# Patient Record
Sex: Female | Born: 1985 | Race: White | Hispanic: No | Marital: Married | Smoking: Former smoker
Health system: Southern US, Community
[De-identification: ages and names within clinical notes are randomized; demographics above are authoritative.]

## PROBLEM LIST (undated history)

## (undated) DIAGNOSIS — R87629 Unspecified abnormal cytological findings in specimens from vagina: Secondary | ICD-10-CM

## (undated) DIAGNOSIS — F329 Major depressive disorder, single episode, unspecified: Secondary | ICD-10-CM

## (undated) DIAGNOSIS — F419 Anxiety disorder, unspecified: Secondary | ICD-10-CM

## (undated) DIAGNOSIS — F32A Depression, unspecified: Secondary | ICD-10-CM

## (undated) DIAGNOSIS — N83201 Unspecified ovarian cyst, right side: Secondary | ICD-10-CM

## (undated) HISTORY — DX: Unspecified ovarian cyst, right side: N83.201

## (undated) HISTORY — PX: BREAST SURGERY: SHX581

## (undated) HISTORY — PX: WISDOM TOOTH EXTRACTION: SHX21

## (undated) HISTORY — DX: Unspecified abnormal cytological findings in specimens from vagina: R87.629

## (undated) HISTORY — DX: Depression, unspecified: F32.A

## (undated) HISTORY — DX: Anxiety disorder, unspecified: F41.9

## (undated) HISTORY — DX: Major depressive disorder, single episode, unspecified: F32.9

## (undated) HISTORY — PX: NO PAST SURGERIES: SHX2092

---

## 2011-01-18 HISTORY — PX: BREAST SURGERY: SHX581

## 2015-11-05 ENCOUNTER — Encounter: Payer: Self-pay | Admitting: Gynecologic Oncology

## 2015-11-05 ENCOUNTER — Ambulatory Visit: Payer: PRIVATE HEALTH INSURANCE | Attending: Gynecologic Oncology | Admitting: Gynecologic Oncology

## 2015-11-05 VITALS — BP 114/66 | HR 73 | Temp 98.8°F | Resp 18 | Ht 64.25 in | Wt 128.4 lb

## 2015-11-05 DIAGNOSIS — Z1501 Genetic susceptibility to malignant neoplasm of breast: Secondary | ICD-10-CM

## 2015-11-05 DIAGNOSIS — Z1502 Genetic susceptibility to malignant neoplasm of ovary: Secondary | ICD-10-CM | POA: Diagnosis not present

## 2015-11-05 DIAGNOSIS — Z87891 Personal history of nicotine dependence: Secondary | ICD-10-CM | POA: Diagnosis not present

## 2015-11-05 DIAGNOSIS — Z803 Family history of malignant neoplasm of breast: Secondary | ICD-10-CM | POA: Insufficient documentation

## 2015-11-05 DIAGNOSIS — Z1509 Genetic susceptibility to other malignant neoplasm: Secondary | ICD-10-CM

## 2015-11-05 NOTE — Patient Instructions (Addendum)
Plan to have an ultrasound and CA 125 on October 25.  [facility] at 10:15 to have your CA 125 drawn then head [facility] for your ultrasound and arrive with a full bladder.  Plan again for a CA 125 and ultrasound in six months time prior to your appointment with Dr. Nelly RoutBrewster.  Please call us closer to the date so we can schedule your ultrasound (it must be between day 3-10 of your cycle).  At that time, we will arrange for a lab appt and adjust your appointment with Dr. Nelly RoutBrewster as well if needed.

## 2015-11-05 NOTE — Progress Notes (Signed)
Consult Note: Gyn-Onc     Sandra Petty 30 y.o. female- self-referral  CC:  Chief Complaint  Patient presents with  . BRCA 2    New patient    Assessment/Plan:  Ms. Sandra Petty is a 30 y.o.  with a deleterious BRCA2 mutation. 1. The risk of ovarian cancer in patients with BRCA 2 mutations is approximately 11-23% to the age of 42.  In addition, patients with BRCA mutations are also at increased risk of breast cancer, and an increased (but still low) risk of pancreatic cancer and melanoma. 2. The [employer]  (NCCN) recommends removal of bilateral ovaries and fallopian tubes between the ages of 67-40, and/or when childbearing is completed, to reduce the risk of ovarian and fallopian tube cancer.  If a  patient does not undergo risk-reducing surgery, it is recommended they have CA-125 serum levels and pelvic ultrasounds every 6 months as a screening approach, although these are not particularly sensitive methods of screening.  Today is day 3 of her cycle. The ultrasound and CA-125 will be scheduled for next week.  Follow-up in 6 months with CA-125 and ultrasound immediately prior to the visit 3. Preventive surgery to remove the ovaries and fallopian tubes reduces the risk of a related cancer by 80% in women who carry a  BRCA2 mutation.  Women who undergo preventive surgery retain a 4% risk of developing cancer of the peritoneum.  4. Although long-term oral contraceptive use is suggested to reduce the risk of ovarian cancer among women who carry mutations of BRCA2, data on the effect  of oral contraceptives on breast cancer risk are inconsistent. 5. Consultation with a reproductive endocrinologist may be of benefit given the two-year attempt to become pregnant   HPI: Ms. Sandra Petty is a 30 y.o.  gravida 0 with a deleterious BRCA2 gene mutation. She is married and has been casually trying to become pregnant for the last 2 years.  Family history is notable for a mother  diagnosed with 2 primary breast cancers first at age 48 and the second at age 23. There is no other family history of pancreatic prostate or ovarian cancer. She states that she is of German/Russian ethnicity and denies any asking Asian heritage. Last MRI of the breast was in 2016.  Review of Systems:  Constitutional  Feels well,  Cardiovascular  No chest pain, shortness of breath, or edema  Pulmonary  No cough or wheeze.  Gastro Intestinal  No nausea, vomitting, or diarrhoea. No bright red blood per rectum, no abdominal pain, change in bowel movement, or constipation, No abdominal bloating or early satiety Genito Urinary  No frequency, urgency, dysuria, Reports midcycle bleeding  Musculo Skeletal  No myalgia, arthralgia, joint swelling or pain  Neurologic  No weakness, numbness, change in gait,  Psychology  No depression, anxiety, insomnia.    Current Meds:  Outpatient Encounter Prescriptions as of 11/05/2015  Medication Sig  . EVENING PRIMROSE OIL PO Take 1,500 mg by mouth daily.  . Multiple Vitamins-Minerals (WOMENS MULTI) CAPS Take by mouth.   No facility-administered encounter medications on file as of 11/05/2015.     Allergy: Not on File  Social Hx:   Social History   Social History  . Marital status: Married    Spouse name: N/A  . Number of children: N/A  . Years of education: N/A   Occupational History  . Not on file.   Social History Main Topics  . Smoking status: Former Smoker    Packs/day:  0.25    Types: Cigarettes    Quit date: 11/04/2008  . Smokeless tobacco: Never Used  . Alcohol use Yes     Comment: wine with dinner   . Drug use: No  . Sexual activity: Not on file   Other Topics Concern  . Not on file   Social History Narrative  . No narrative on file    Past Surgical Hx: History reviewed. No pertinent surgical history.  Past Medical Hx:  Past Medical History:  Diagnosis Date  . Anxiety   . Depression     Past Gynecological  History:Gravida 0, and acute age 30 regular menses with midcycle spotting use Yaz for several years in her mid 32s. Last Pap test March 2017 within normal limits. Has been casually trying to become pregnant for approximately 2 years   Family Hx:  Family History  Problem Relation Age of Onset  . Cancer Mother   . Cancer Paternal Grandmother   . Cancer Paternal Grandfather     Vitals:  Blood pressure 114/66, pulse 73, temperature 98.8 F (37.1 C), temperature source Oral, resp. rate 18, height 5' 4.25" (1.632 m), weight 128 lb 6.4 oz (58.2 kg), SpO2 100 %.  Physical Exam: WD in NAD Neck  Supple NROM, without any enlargements.  Lymph Node Survey No cervical supraclavicular or inguinal adenopathy Cardiovascular  Pulse normal rate, regularity and rhythm. .  Lungs  Clear to auscultation bilaterally, without wheezes/crackles/rhonchi. Good air movement.  Skin  No rash/lesions/breakdown  Psychiatry  Alert and oriented appropriate mood affect speech and reasoning. Abdomen  Normoactive bowel sounds, abdomen soft, non-tender.  Back No CVA tenderness Genito Urinary  Vulva/vagina: Normal external female genitalia.  No lesions. No discharge or bleeding.  Bladder/urethra:  No lesions or masses  Vagina: Well estrogenized blood present in the vaginal vault .  Cervix: Normal appearing, no lesions, 2 cm blood in the cervical os  Uterus: Mobile 6 cm, no parametrial involvement or nodularity.  Adnexa: No palpable masses. No cul-de-sac nodularity Rectal  Good tone, no masses no cul de sac nodularity.  Extremities  No bilateral cyanosis, clubbing or edema.   Janie Morning, MD, PhD 11/05/2015, 5:17 PM

## 2015-11-11 ENCOUNTER — Ambulatory Visit (HOSPITAL_COMMUNITY)
Admission: RE | Admit: 2015-11-11 | Discharge: 2015-11-11 | Disposition: A | Discharge status: Home / Self Care | Payer: PRIVATE HEALTH INSURANCE | Source: Ambulatory Visit | Attending: Gynecologic Oncology | Admitting: Gynecologic Oncology

## 2015-11-11 ENCOUNTER — Other Ambulatory Visit (HOSPITAL_BASED_OUTPATIENT_CLINIC_OR_DEPARTMENT_OTHER): Payer: PRIVATE HEALTH INSURANCE

## 2015-11-11 DIAGNOSIS — R938 Abnormal findings on diagnostic imaging of other specified body structures: Secondary | ICD-10-CM | POA: Diagnosis not present

## 2015-11-11 DIAGNOSIS — Z1502 Genetic susceptibility to malignant neoplasm of ovary: Secondary | ICD-10-CM | POA: Diagnosis not present

## 2015-11-11 DIAGNOSIS — Z1509 Genetic susceptibility to other malignant neoplasm: Secondary | ICD-10-CM

## 2015-11-11 DIAGNOSIS — Z1501 Genetic susceptibility to malignant neoplasm of breast: Secondary | ICD-10-CM

## 2015-11-12 LAB — CA 125: CANCER ANTIGEN (CA) 125: 11.1 U/mL (ref 0.0–38.1)

## 2015-11-13 ENCOUNTER — Telehealth: Payer: Self-pay | Admitting: Gynecologic Oncology

## 2015-11-13 NOTE — Telephone Encounter (Signed)
Patient called with several questions about her CA 125 and US results.  All questions answered.  Advised to call for any needs or concerns.

## 2015-11-13 NOTE — Telephone Encounter (Signed)
Left message for patient with CA 125 and US results along with Dr. Forrestine HimBrewster's recommendations for repeat US in six months.  Advised to call the office for any questions or concerns.

## 2015-12-16 ENCOUNTER — Telehealth: Payer: Self-pay | Admitting: *Deleted

## 2015-12-16 NOTE — Telephone Encounter (Signed)
Call from scheduling pt called cancelled appt in April- declined to reschedule.

## 2015-12-22 ENCOUNTER — Telehealth: Payer: Self-pay | Admitting: Gynecologic Oncology

## 2015-12-22 NOTE — Telephone Encounter (Signed)
Patient had called after hours yesterday asking her bills.  Returned call to patient.  She discussed her bills and stating she spoke with billing.  Advised to call her insurance.

## 2015-12-30 ENCOUNTER — Telehealth: Payer: Self-pay | Admitting: Nurse Practitioner

## 2015-12-30 NOTE — Telephone Encounter (Signed)
Patient calling again to inquire about status of insurance and billing. She is reporting bills are piling up from office visits and ultrasound. Pt says that her appt was billed as "hospital outpatient facility" instead of "specialist doctor office" and needs a code changed from 22 to 11. She informs that the "place of service" is causing increased bills. Rn will inquire and update patient.

## 2015-12-30 NOTE — Telephone Encounter (Addendum)
I notified patient of the message below per Warner MccreedyMelissa Cross, NP.  ----- Message from Doylene BodeMelissa D Cross, NP sent at 12/29/2015  4:26 PM EST ----- Clayborn HeronLacie or Marthe PatchLorri,   Could you please let her know that the coders are still looking into the charges and codes from her visit with Dr. Nelly RoutBrewster.  Just wanted her to know that we had not forgotten about her.  Will send the coders another message to follow up.  Thanks! Mel

## 2015-12-31 ENCOUNTER — Telehealth: Payer: Self-pay | Admitting: Nurse Practitioner

## 2015-12-31 NOTE — Telephone Encounter (Signed)
Per Efraim KaufmannMelissa, NP, called patient to inform her that billing/coding dept states code "22" for place of service is correct. Unable to reach patient but left message to call clinic. Will advise that she call [phone] and ask for billing dept if she has further questions.

## 2015-12-31 NOTE — Telephone Encounter (Signed)
Patient called back and RN advised to call [phone] main hospital and as for billing dept with further questions regarding her bill. She verbalizes understanding.

## 2016-04-18 ENCOUNTER — Ambulatory Visit: Payer: PRIVATE HEALTH INSURANCE | Admitting: Gynecologic Oncology

## 2016-06-07 ENCOUNTER — Other Ambulatory Visit: Payer: Self-pay | Admitting: Obstetrics and Gynecology

## 2016-12-13 LAB — OB RESULTS CONSOLE HEPATITIS B SURFACE ANTIGEN: HEP B S AG: NEGATIVE

## 2016-12-13 LAB — OB RESULTS CONSOLE RPR: RPR: NONREACTIVE

## 2016-12-13 LAB — OB RESULTS CONSOLE RUBELLA ANTIBODY, IGM: Rubella: IMMUNE

## 2016-12-13 LAB — OB RESULTS CONSOLE HIV ANTIBODY (ROUTINE TESTING): HIV: NONREACTIVE

## 2016-12-13 LAB — OB RESULTS CONSOLE GC/CHLAMYDIA
Chlamydia: NEGATIVE
Gonorrhea: NEGATIVE

## 2017-01-18 LAB — OB RESULTS CONSOLE GBS: STREP GROUP B AG: NEGATIVE

## 2017-03-27 ENCOUNTER — Inpatient Hospital Stay (HOSPITAL_COMMUNITY)
Admission: AD | Admit: 2017-03-27 | Payer: PRIVATE HEALTH INSURANCE | Source: Ambulatory Visit | Admitting: Obstetrics and Gynecology

## 2017-07-08 ENCOUNTER — Encounter (HOSPITAL_COMMUNITY): Payer: Self-pay | Admitting: *Deleted

## 2017-07-08 ENCOUNTER — Inpatient Hospital Stay (HOSPITAL_COMMUNITY): Payer: PRIVATE HEALTH INSURANCE | Admitting: Anesthesiology

## 2017-07-08 ENCOUNTER — Inpatient Hospital Stay (HOSPITAL_COMMUNITY)
Admission: AD | Admit: 2017-07-08 | Discharge: 2017-07-10 | DRG: 807 | Disposition: A | Discharge status: Home / Self Care | Payer: PRIVATE HEALTH INSURANCE | Attending: Obstetrics and Gynecology | Admitting: Obstetrics and Gynecology

## 2017-07-08 ENCOUNTER — Encounter (HOSPITAL_COMMUNITY): Payer: Self-pay

## 2017-07-08 ENCOUNTER — Inpatient Hospital Stay (HOSPITAL_COMMUNITY)
Admission: AD | Admit: 2017-07-08 | Discharge: 2017-07-08 | Disposition: A | Discharge status: Home / Self Care | Payer: PRIVATE HEALTH INSURANCE | Source: Ambulatory Visit | Attending: Obstetrics and Gynecology | Admitting: Obstetrics and Gynecology

## 2017-07-08 DIAGNOSIS — Z3A38 38 weeks gestation of pregnancy: Secondary | ICD-10-CM

## 2017-07-08 DIAGNOSIS — Z3483 Encounter for supervision of other normal pregnancy, third trimester: Secondary | ICD-10-CM | POA: Diagnosis present

## 2017-07-08 DIAGNOSIS — Z87891 Personal history of nicotine dependence: Secondary | ICD-10-CM | POA: Diagnosis not present

## 2017-07-08 DIAGNOSIS — O471 False labor at or after 37 completed weeks of gestation: Secondary | ICD-10-CM

## 2017-07-08 LAB — CBC
HCT: 40.5 % (ref 36.0–46.0)
HEMOGLOBIN: 14.3 g/dL (ref 12.0–15.0)
MCH: 32.1 pg (ref 26.0–34.0)
MCHC: 35.3 g/dL (ref 30.0–36.0)
MCV: 90.8 fL (ref 78.0–100.0)
PLATELETS: 191 10*3/uL (ref 150–400)
RBC: 4.46 MIL/uL (ref 3.87–5.11)
RDW: 12.1 % (ref 11.5–15.5)
WBC: 15.6 10*3/uL — AB (ref 4.0–10.5)

## 2017-07-08 LAB — POCT FERN TEST: POCT Fern Test: POSITIVE

## 2017-07-08 MED ORDER — LIDOCAINE HCL (PF) 1 % IJ SOLN
INTRAMUSCULAR | Status: DC | PRN
  Administered 2017-07-08 (×2): 4 mL via EPIDURAL

## 2017-07-08 MED ORDER — IBUPROFEN 600 MG PO TABS
600.0000 mg | ORAL_TABLET | Freq: Four times a day (QID) | ORAL | Status: DC
  Administered 2017-07-08 – 2017-07-10 (×7): 600 mg via ORAL
  Filled 2017-07-08 (×7): qty 1

## 2017-07-08 MED ORDER — FENTANYL 2.5 MCG/ML BUPIVACAINE 1/10 % EPIDURAL INFUSION (WH - ANES): 14.0000 mL/h | INTRAMUSCULAR | Status: DC | PRN

## 2017-07-08 MED ORDER — DIPHENHYDRAMINE HCL 50 MG/ML IJ SOLN: 12.5000 mg | INTRAMUSCULAR | Status: DC | PRN

## 2017-07-08 MED ORDER — MEASLES, MUMPS & RUBELLA VAC ~~LOC~~ INJ
0.5000 mL | INJECTION | Freq: Once | SUBCUTANEOUS | Status: DC
  Filled 2017-07-08: qty 0.5

## 2017-07-08 MED ORDER — PRENATAL MULTIVITAMIN CH
1.0000 | ORAL_TABLET | Freq: Every day | ORAL | Status: DC
  Administered 2017-07-09 – 2017-07-10 (×2): 1 via ORAL
  Filled 2017-07-08 (×2): qty 1

## 2017-07-08 MED ORDER — FENTANYL 2.5 MCG/ML BUPIVACAINE 1/10 % EPIDURAL INFUSION (WH - ANES)
INTRAMUSCULAR | Status: AC
  Filled 2017-07-08: qty 100

## 2017-07-08 MED ORDER — PHENYLEPHRINE 40 MCG/ML (10ML) SYRINGE FOR IV PUSH (FOR BLOOD PRESSURE SUPPORT)
80.0000 ug | PREFILLED_SYRINGE | INTRAVENOUS | Status: DC | PRN
  Filled 2017-07-08: qty 5

## 2017-07-08 MED ORDER — EPHEDRINE 5 MG/ML INJ
10.0000 mg | INTRAVENOUS | Status: DC | PRN
  Filled 2017-07-08: qty 2

## 2017-07-08 MED ORDER — SENNOSIDES-DOCUSATE SODIUM 8.6-50 MG PO TABS
2.0000 | ORAL_TABLET | ORAL | Status: DC
  Administered 2017-07-08 – 2017-07-10 (×2): 2 via ORAL
  Filled 2017-07-08 (×2): qty 2

## 2017-07-08 MED ORDER — WITCH HAZEL-GLYCERIN EX PADS: 1.0000 "application " | MEDICATED_PAD | CUTANEOUS | Status: DC | PRN

## 2017-07-08 MED ORDER — OXYCODONE-ACETAMINOPHEN 5-325 MG PO TABS: 2.0000 | ORAL_TABLET | ORAL | Status: DC | PRN

## 2017-07-08 MED ORDER — LACTATED RINGERS IV SOLN
500.0000 mL | Freq: Once | INTRAVENOUS | Status: AC
  Administered 2017-07-08: 500 mL via INTRAVENOUS

## 2017-07-08 MED ORDER — MEDROXYPROGESTERONE ACETATE 150 MG/ML IM SUSP: 150.0000 mg | INTRAMUSCULAR | Status: DC | PRN

## 2017-07-08 MED ORDER — OXYCODONE-ACETAMINOPHEN 5-325 MG PO TABS: 1.0000 | ORAL_TABLET | ORAL | Status: DC | PRN

## 2017-07-08 MED ORDER — ONDANSETRON HCL 4 MG/2ML IJ SOLN: 4.0000 mg | Freq: Four times a day (QID) | INTRAMUSCULAR | Status: DC | PRN

## 2017-07-08 MED ORDER — ONDANSETRON HCL 4 MG PO TABS: 4.0000 mg | ORAL_TABLET | ORAL | Status: DC | PRN

## 2017-07-08 MED ORDER — LIDOCAINE HCL (PF) 1 % IJ SOLN
30.0000 mL | INTRAMUSCULAR | Status: DC | PRN
  Filled 2017-07-08: qty 30

## 2017-07-08 MED ORDER — BENZOCAINE-MENTHOL 20-0.5 % EX AERO
1.0000 "application " | INHALATION_SPRAY | CUTANEOUS | Status: DC | PRN
  Administered 2017-07-10: 1 via TOPICAL
  Filled 2017-07-08: qty 56

## 2017-07-08 MED ORDER — TETANUS-DIPHTH-ACELL PERTUSSIS 5-2.5-18.5 LF-MCG/0.5 IM SUSP: 0.5000 mL | Freq: Once | INTRAMUSCULAR | Status: DC

## 2017-07-08 MED ORDER — OXYTOCIN 40 UNITS IN LACTATED RINGERS INFUSION - SIMPLE MED
2.5000 [IU]/h | INTRAVENOUS | Status: DC
  Administered 2017-07-08: 2.5 [IU]/h via INTRAVENOUS
  Filled 2017-07-08: qty 1000

## 2017-07-08 MED ORDER — COCONUT OIL OIL
1.0000 "application " | TOPICAL_OIL | Status: DC | PRN
  Filled 2017-07-08: qty 120

## 2017-07-08 MED ORDER — PHENYLEPHRINE 40 MCG/ML (10ML) SYRINGE FOR IV PUSH (FOR BLOOD PRESSURE SUPPORT)
80.0000 ug | PREFILLED_SYRINGE | INTRAVENOUS | Status: DC | PRN
  Administered 2017-07-08 (×2): 80 ug via INTRAVENOUS
  Filled 2017-07-08: qty 5

## 2017-07-08 MED ORDER — FENTANYL 2.5 MCG/ML BUPIVACAINE 1/10 % EPIDURAL INFUSION (WH - ANES)
14.0000 mL/h | INTRAMUSCULAR | Status: DC | PRN
  Administered 2017-07-08: 14 mL/h via EPIDURAL

## 2017-07-08 MED ORDER — DIBUCAINE 1 % RE OINT: 1.0000 "application " | TOPICAL_OINTMENT | RECTAL | Status: DC | PRN

## 2017-07-08 MED ORDER — SOD CITRATE-CITRIC ACID 500-334 MG/5ML PO SOLN: 30.0000 mL | ORAL | Status: DC | PRN

## 2017-07-08 MED ORDER — SIMETHICONE 80 MG PO CHEW: 80.0000 mg | CHEWABLE_TABLET | ORAL | Status: DC | PRN

## 2017-07-08 MED ORDER — LACTATED RINGERS IV SOLN
INTRAVENOUS | Status: DC
  Administered 2017-07-08 (×2): via INTRAVENOUS

## 2017-07-08 MED ORDER — ONDANSETRON HCL 4 MG/2ML IJ SOLN: 4.0000 mg | INTRAMUSCULAR | Status: DC | PRN

## 2017-07-08 MED ORDER — ACETAMINOPHEN 325 MG PO TABS: 650.0000 mg | ORAL_TABLET | ORAL | Status: DC | PRN

## 2017-07-08 MED ORDER — ACETAMINOPHEN 325 MG PO TABS
650.0000 mg | ORAL_TABLET | ORAL | Status: DC | PRN
  Administered 2017-07-10: 650 mg via ORAL
  Filled 2017-07-08: qty 2

## 2017-07-08 MED ORDER — OXYTOCIN BOLUS FROM INFUSION
500.0000 mL | Freq: Once | INTRAVENOUS | Status: AC
  Administered 2017-07-08: 500 mL via INTRAVENOUS

## 2017-07-08 MED ORDER — DIPHENHYDRAMINE HCL 25 MG PO CAPS: 25.0000 mg | ORAL_CAPSULE | Freq: Four times a day (QID) | ORAL | Status: DC | PRN

## 2017-07-08 MED ORDER — LACTATED RINGERS IV SOLN
500.0000 mL | INTRAVENOUS | Status: DC | PRN
  Administered 2017-07-08: 500 mL via INTRAVENOUS

## 2017-07-08 MED ORDER — PHENYLEPHRINE 40 MCG/ML (10ML) SYRINGE FOR IV PUSH (FOR BLOOD PRESSURE SUPPORT)
PREFILLED_SYRINGE | INTRAVENOUS | Status: AC
  Administered 2017-07-08: 80 ug via INTRAVENOUS
  Filled 2017-07-08: qty 20

## 2017-07-08 NOTE — Progress Notes (Signed)
Baby moving well.

## 2017-07-08 NOTE — MAU Note (Signed)
Lenard LanceRachel Sadler is a 32 y.o. at 5147w0d here in MAU reporting: +contractions States have gotten worse since being discharged from MAU previously  +LOF clear, having to wear a pad, patient reports LOF started about 1 hour ago (6am) Denies any complications with this pregnancy +bloody show +FM Onset of complaint: yesterday Pain score: 7/10 currently Vitals:   07/08/17 0711  BP: 131/77  Pulse: 73  Resp: 19  Temp: (!) 97.5 F (36.4 C)  SpO2: 98%     FHT: 151 Lab orders placed from triage: none

## 2017-07-08 NOTE — Progress Notes (Signed)
Pt uncomfortable w/ ctx.  Declines epidural.  Using nitrous  FHT cat 1 Toco Q2 Cvx 6/90/-1  A/P:  Continue exp mngt Epidural prn

## 2017-07-08 NOTE — H&P (Signed)
Sandra LanceRachel Petty is a [age] y.o. female presenting for SOL.  Pt reports ctx stronger and more frequent.  + LOF, clear  OB History    Gravida  3   Para      Term      Preterm      AB  2   Living  0     SAB      TAB      Ectopic      Multiple      Live Births             Past Medical History:  Diagnosis Date  . Anxiety   . Depression    Past Surgical History:  Procedure Laterality Date  . NO PAST SURGERIES     Family History: family history includes Cancer in her mother, paternal grandfather, and paternal grandmother. Social History:  reports that she quit smoking about 8 years ago. Her smoking use included cigarettes. She smoked 0.25 packs per day. She has never used smokeless tobacco. She reports that she drinks alcohol. She reports that she does not use drugs.     Maternal Diabetes: No Genetic Screening: Declined Maternal Ultrasounds/Referrals: Normal Fetal Ultrasounds or other Referrals:  None Maternal Substance Abuse:  No Significant Maternal Medications:  None Significant Maternal Lab Results:  None Other Comments:  None  ROS History Dilation: 2 Effacement (%): 90 Station: -1 Exam by:: B. Bowen, RN Blood pressure 131/77, pulse 73, temperature (!) 97.5 F (36.4 C), temperature source Oral, resp. rate 19, weight 165 lb (74.8 kg), SpO2 98 %. Exam Physical Exam  Gen - uncomfortable w/ ctx Abd - gravid, NT Ext - NT, no edema Cvx 2-3cm Prenatal labs: ABO, Rh:   Antibody:   Rubella:   RPR:    HBsAg:    HIV:    GBS:   negative  Assessment/Plan: Admit Epidural prn  Sandra Petty 07/08/2017, 9:10 AM

## 2017-07-08 NOTE — Progress Notes (Signed)
Dr Renaldo FiddlerAdkins notified of pt's admission and status. Aware of ctx pattern, sve, elevated b/ps which have trended downward. Pt stable for d/c home.

## 2017-07-08 NOTE — MAU Note (Signed)
Ctxs since Friday evening. Stronger now. Denies LOF or bleeding

## 2017-07-08 NOTE — Anesthesia Pain Management Evaluation Note (Signed)
  CRNA Pain Management Visit Note  Patient: Sandra Petty, 32 y.o., female  "Hello I am a member of the anesthesia team at [facility]. We have an anesthesia team available at all times to provide care throughout the hospital, including epidural management and anesthesia for C-section. I don't know your plan for the delivery whether it a natural birth, water birth, IV sedation, nitrous supplementation, doula or epidural, but we want to meet your pain goals."   1.Was your pain managed to your expectations on prior hospitalizations?   No prior hospitalizations  2.What is your expectation for pain management during this hospitalization?     Labor support without medications  3.How can we help you reach that goal?   Record the patient's initial score and the patient's pain goal.   Pain: 0  Pain Goal: 10 [facility] wants you to be able to say your pain was always managed very well.  Sandra Petty,Sandra Petty 07/08/2017

## 2017-07-08 NOTE — Anesthesia Procedure Notes (Signed)
Epidural Patient location during procedure: OB  Staffing Anesthesiologist: Sofija Antwi, MD Performed: anesthesiologist   Preanesthetic Checklist Completed: patient identified, pre-op evaluation, timeout performed, IV checked, risks and benefits discussed and monitors and equipment checked  Epidural Patient position: sitting Prep: site prepped and draped and DuraPrep Patient monitoring: heart rate, continuous pulse ox and blood pressure Approach: midline Location: L3-L4 Injection technique: LOR air and LOR saline  Needle:  Needle type: Tuohy  Needle gauge: 17 G Needle length: 9 cm Needle insertion depth: 5 cm Catheter type: closed end flexible Catheter size: 19 Gauge Catheter at skin depth: 10 cm Test dose: negative  Assessment Sensory level: T8 Events: blood not aspirated, injection not painful, no injection resistance, negative IV test and no paresthesia  Additional Notes Reason for block:procedure for pain     

## 2017-07-08 NOTE — Progress Notes (Signed)
Dr Renaldo FiddlerAdkins aware pt wants to walk an hour and be rechecked. MD ok for pt to walk. If no change pt may go home without return call to MD.

## 2017-07-08 NOTE — MAU Note (Signed)
Pt prefers to walk an hour and be rechecked. Dr Renaldo FiddlerAdkins notified and will reck pt in an hour.

## 2017-07-08 NOTE — Anesthesia Preprocedure Evaluation (Signed)
Anesthesia Evaluation  Patient identified by MRN, date of birth, ID band Patient awake    Reviewed: Allergy & Precautions, NPO status , Patient's Chart, lab work & pertinent test results  Airway Mallampati: II  TM Distance: >3 FB Neck ROM: Full    Dental no notable dental hx.    Pulmonary former smoker,    Pulmonary exam normal breath sounds clear to auscultation       Cardiovascular negative cardio ROS Normal cardiovascular exam Rhythm:Regular Rate:Normal     Neuro/Psych PSYCHIATRIC DISORDERS Anxiety Depression negative neurological ROS     GI/Hepatic negative GI ROS, Neg liver ROS,   Endo/Other  negative endocrine ROS  Renal/GU negative Renal ROS     Musculoskeletal negative musculoskeletal ROS (+)   Abdominal   Peds  Hematology negative hematology ROS (+)   Anesthesia Other Findings   Reproductive/Obstetrics (+) Pregnancy                             Anesthesia Physical Anesthesia Plan  ASA: II  Anesthesia Plan: Epidural   Post-op Pain Management:    Induction:   PONV Risk Score and Plan:   Airway Management Planned:   Additional Equipment:   Intra-op Plan:   Post-operative Plan:   Informed Consent: I have reviewed the patients History and Physical, chart, labs and discussed the procedure including the risks, benefits and alternatives for the proposed anesthesia with the patient or authorized representative who has indicated his/her understanding and acceptance.     Plan Discussed with:   Anesthesia Plan Comments:         Anesthesia Quick Evaluation

## 2017-07-08 NOTE — Progress Notes (Signed)
Written and verbal d/c instructions given and understanding voiced. Labor precautions given 

## 2017-07-08 NOTE — Progress Notes (Signed)
SVD of vigorous female infant w/ apgars of 9,9.  Mild 30 sec shoulder dystocia resolved with [employer]crew Placenta delivered spontaneous w/ 3VC.   2nd degree lac repaired w/ 3-0 vicryl rapide.  Fundus firm.  EBL 150cc .

## 2017-07-09 LAB — RPR: RPR Ser Ql: NONREACTIVE

## 2017-07-09 LAB — CBC
HCT: 37.4 % (ref 36.0–46.0)
HEMOGLOBIN: 13 g/dL (ref 12.0–15.0)
MCH: 31.7 pg (ref 26.0–34.0)
MCHC: 34.8 g/dL (ref 30.0–36.0)
MCV: 91.2 fL (ref 78.0–100.0)
PLATELETS: 173 10*3/uL (ref 150–400)
RBC: 4.1 MIL/uL (ref 3.87–5.11)
RDW: 12.3 % (ref 11.5–15.5)
WBC: 18 10*3/uL — ABNORMAL HIGH (ref 4.0–10.5)

## 2017-07-09 NOTE — Lactation Note (Signed)
This note was copied from a baby's chart. Lactation Consultation Note  Patient Name: Sandra [name] YNWGN'FToday's Date: 07/09/2017 Reason for consult: Initial assessment;Early term 37-38.6wks(As LC entered room Mom and dad requested time for a nap - LC consult still needs to be done . )  Baby is 22 hours old  LC reviewed doc flow sheets and noted the baby last fed at 1430 for 30 mins  And has breast fed x 6, 5- 40 mins , latch scores range 6-7 . 4 wets , 2 stools.  LC will report to the on coming LC at 7P - mom still needs feeding assessment.     Maternal Data    Feeding Feeding Type: Breast Fed Length of feed: 30 min  LATCH Score                   Interventions Interventions: Breast feeding basics reviewed  Lactation Tools Discussed/Used     Consult Status Consult Status: Follow-up Date: 07/09/17 Follow-up type: In-patient    Sandra Petty 07/09/2017, 5:30 PM

## 2017-07-09 NOTE — Progress Notes (Signed)
Post Partum Day 1 Subjective: no complaints. Declines circ  Objective: Blood pressure 111/75, pulse 87, temperature 98.2 F (36.8 C), temperature source Oral, resp. rate 18, height 5\' 5"  (1.651 m), weight 165 lb (74.8 kg), SpO2 99 %, unknown if currently breastfeeding.  Physical Exam:  General: alert and cooperative Lochia: appropriate Uterine Fundus: firm Incision: healing well, no significant drainage DVT Evaluation: No evidence of DVT seen on physical exam.  Recent Labs    07/08/17 0905 07/09/17 0609  HGB 14.3 13.0  HCT 40.5 37.4    Assessment/Plan: Plan for discharge tomorrow   LOS: 1 day   Zelphia CairoGretchen Vinson Tietze 07/09/2017, 8:21 AM

## 2017-07-09 NOTE — Progress Notes (Signed)
CSW received consult for MOB due to her history of anxiety. CSW met with MOB, FOB, and newborn at bedside to discuss matter. CSW obtained permission from MOB to speak with FOB present. MOB and FOB have not decided on a name for newborn yet. CSW inquired with MOB about history of anxiety, MOB stated that she still has general anxiety but it is not debilitating and that she copes using self coping strategies. MOB reports having her placenta turned into capsules to help address the hormone imbalance that may occur with her being susceptible to postpartum depression. CSW educated parents on postpartum depression versus baby blues period. CSW encouraged parents to reach out to [facility] or [facility] CSW department if needs or questions arise.  Madilyn Fireman, MSW, [place] Social Worker [facility] [phone]

## 2017-07-09 NOTE — Anesthesia Postprocedure Evaluation (Signed)
Anesthesia Post Note  Patient: Lenard LanceRachel Laur  Procedure(s) Performed: AN AD HOC LABOR EPIDURAL     Patient location during evaluation: Mother Baby Anesthesia Type: Epidural Level of consciousness: awake and alert Pain management: pain level controlled Vital Signs Assessment: post-procedure vital signs reviewed and stable Respiratory status: spontaneous breathing, nonlabored ventilation and respiratory function stable Cardiovascular status: stable Postop Assessment: no headache, no backache, epidural receding, able to ambulate, adequate PO intake, no apparent nausea or vomiting and patient able to bend at knees Anesthetic complications: no    Last Vitals:  Vitals:   07/09/17 0234 07/09/17 0619  BP: 112/76 111/75  Pulse: 75 87  Resp: 18 18  Temp: 36.6 C 36.8 C  SpO2:      Last Pain:  Vitals:   07/09/17 1013  TempSrc:   PainSc: 3    Pain Goal: Patients Stated Pain Goal: Other (Comment)(pt desires unmedicated birth) (07/08/17 1107)               Laban EmperorMalinova,Aylin Rhoads Hristova

## 2017-07-10 ENCOUNTER — Other Ambulatory Visit: Payer: Self-pay

## 2017-07-10 MED ORDER — IBUPROFEN 600 MG PO TABS: 600.0000 mg | ORAL_TABLET | Freq: Four times a day (QID) | ORAL | 0 refills | Status: DC

## 2017-07-10 NOTE — Lactation Note (Signed)
This note was copied from a baby's chart. Lactation Consultation Note  Patient Name: Sandra Lenard LanceRachel Fauteux OZHYQ'MToday's Date: 07/10/2017 Reason for consult: Follow-up assessment Mom states nipples are a little sore.  She is using coconut oil and working hard to obtain a deep latch.  Discussed milk coming to volume and engorgement treatment.  Questions answered.  Lactation outpatient services and support reviewed and encouraged prn.  Maternal Data Formula Feeding for Exclusion: No Has patient been taught Hand Expression?: Yes Does the patient have breastfeeding experience prior to this delivery?: No  Feeding Feeding Type: Breast Fed  LATCH Score Latch: Grasps breast easily, tongue down, lips flanged, rhythmical sucking.  Audible Swallowing: Spontaneous and intermittent  Type of Nipple: Everted at rest and after stimulation  Comfort (Breast/Nipple): Filling, red/small blisters or bruises, mild/mod discomfort  Hold (Positioning): Assistance needed to correctly position infant at breast and maintain latch.  LATCH Score: 8  Interventions    Lactation Tools Discussed/Used     Consult Status Consult Status: Complete Follow-up type: Call as needed    Huston FoleyMOULDEN, [name] 07/10/2017, 9:35 AM

## 2017-07-10 NOTE — Lactation Note (Signed)
This note was copied from a baby's chart. Lactation Consultation Note  Patient Name: Sandra Petty [place] ZOXWR'UToday's Date: 07/10/2017 Reason for consult: Follow-up assessment;Difficult latch;Early term 37-38.6wks;Primapara;1st time breastfeeding  P1 mother whose infant is now 32 hours.  I heard baby crying loudly as I passed in the hallway.  I offered to assist with latch and mother accepted.  Mother's breasts are feeling full and nipples are everted bilaterally.  Nipples are pink and tender.  Mother states she has been hurting at times with her latch.    Assisted baby to latch in the football hold on the right breast without difficulty.  Breast compressions taught during feeds and baby is swallowing multiple times.  Encouraged mother to continue breast compressions during feedings and to be sure baby is actively feeding at the breast.  Demonstrated ways to awaken a sleepy baby at breast.    He fed 9 minutes at the right breast and self released.  Instructed mother to burp and observe baby.  If he continued to show feeding cues and act hungry to latch to the left breast.  He did requirre more feeding and fed an additional 20 minutes on the left breast in the football hold.  Showed mother proper positioning and support for feeding.  Parents had many questions which I answered to their satisfaction.  Demonstrated a good tight swaddle and father did a return demonstration at the end of the feeding.  Reassured mother many times during breastfeeding and reminded her to obtain a deep latch and watch for flanged lips.  Demonstrated the chin tug and mother felt a difference with her latch.    Encouraged to feed 8-12 times/24 hours or earlier if baby shows feeding cues.  Continue STS, breast massage and hand expression.  Mother will call for assistance as needed.  Parents pleased with infant's feeding and encouraged.  Father present and very interested in learning and helping mother.  Infant placed in bassinet at  the end of the feed and parents resting.  RN in room and updated on feeding.   Maternal Data Formula Feeding for Exclusion: No Has patient been taught Hand Expression?: Yes Does the patient have breastfeeding experience prior to this delivery?: No  Feeding Feeding Type: Breast Fed Length of feed: 30 min  LATCH Score Latch: Grasps breast easily, tongue down, lips flanged, rhythmical sucking.  Audible Swallowing: Spontaneous and intermittent  Type of Nipple: Everted at rest and after stimulation  Comfort (Breast/Nipple): Filling, red/small blisters or bruises, mild/mod discomfort  Hold (Positioning): Assistance needed to correctly position infant at breast and maintain latch.  LATCH Score: 8  Interventions Interventions: Breast feeding basics reviewed;Assisted with latch;Skin to skin;Breast massage;Hand express;Position options;Support pillows;Adjust position;Breast compression  Lactation Tools Discussed/Used     Consult Status Consult Status: Follow-up Date: 07/11/17 Follow-up type: In-patient    Kadeen Sroka R Nithila Sumners 07/10/2017, 4:51 AM

## 2017-07-10 NOTE — Discharge Summary (Signed)
Obstetric Discharge Summary Reason for Admission: onset of labor Prenatal Procedures: none Intrapartum Procedures: spontaneous vaginal delivery Postpartum Procedures: none Complications-Operative and Postpartum: 2nd degree perineal laceration Hemoglobin  Date Value Ref Range Status  07/09/2017 13.0 12.0 - 15.0 g/dL Final   HCT  Date Value Ref Range Status  07/09/2017 37.4 36.0 - 46.0 % Final    Physical Exam:  General: alert, cooperative and appears stated age 89Lochia: appropriate Uterine Fundus: firm Incision: healing well, no significant drainage, no dehiscence DVT Evaluation: No evidence of DVT seen on physical exam.  Discharge Diagnoses: Term Pregnancy-delivered  Discharge Information: Date: 07/10/2017 Activity: pelvic rest Diet: routine Medications: PNV and Ibuprofen Condition: stable Instructions: refer to practice specific booklet Discharge to: home   Newborn Data: Live born female  Birth Weight: 6 lb 14.8 oz (3140 g) APGAR: 9, 9  Newborn Delivery   Birth date/time:  07/08/2017 19:18:00 Delivery type:  Vaginal, Spontaneous     Home with mother.  Sandra Petty 07/10/2017, 8:53 AM

## 2017-07-10 NOTE — Discharge Instructions (Signed)
Call MD for T>100.4, heavy vaginal bleeding, severe abdominal pain, or respiratory distress.  Call office to schedule postpartum visit in 6 weeks.  Pelvic rest x 6 weeks.   

## 2017-07-12 LAB — TYPE AND SCREEN
ABO/RH(D): A NEG
Antibody Screen: POSITIVE
UNIT DIVISION: 0
UNIT DIVISION: 0

## 2017-07-12 LAB — BPAM RBC
BLOOD PRODUCT EXPIRATION DATE: 201907182359
Blood Product Expiration Date: 201907192359
UNIT TYPE AND RH: 600
Unit Type and Rh: 600

## 2020-01-29 ENCOUNTER — Emergency Department (HOSPITAL_COMMUNITY)
Admission: EM | Admit: 2020-01-29 | Discharge: 2020-01-30 | Disposition: A | Discharge status: Home / Self Care | Payer: No Typology Code available for payment source | Attending: Emergency Medicine | Admitting: Emergency Medicine

## 2020-01-29 ENCOUNTER — Other Ambulatory Visit: Payer: Self-pay

## 2020-01-29 DIAGNOSIS — N939 Abnormal uterine and vaginal bleeding, unspecified: Secondary | ICD-10-CM | POA: Insufficient documentation

## 2020-01-29 DIAGNOSIS — Z5321 Procedure and treatment not carried out due to patient leaving prior to being seen by health care provider: Secondary | ICD-10-CM | POA: Diagnosis not present

## 2020-01-29 DIAGNOSIS — R1030 Lower abdominal pain, unspecified: Secondary | ICD-10-CM | POA: Insufficient documentation

## 2020-01-29 LAB — I-STAT BETA HCG BLOOD, ED (MC, WL, AP ONLY): I-stat hCG, quantitative: <5 m[IU]/mL (ref <5)

## 2020-01-29 NOTE — ED Provider Notes (Signed)
I was asked to evaluate this patient by triage nursing.  35 year old female who presents to the ER with abdominal cramping and vaginal spotting which started several days ago, progressively getting worse.  She states that she had a positive pregnancy test on 1/10, had a period in December.  She has only had this confirmed via home pregnancy test.  She states she called her OB/GYN who recommended she come be evaluated for possible ectopic pregnancy.  Arrival, abdomen is mildly tender, vitals reassuring, overall well-appearing.  Resting comfortably in the ER chair.  Pending positive pregnancy test, will contact admitting provider.  Pregnancy test is negative.  She will need further evaluation here in the ED.   Mare Ferrari, PA-C 01/29/20 1958    Rozelle Logan, DO 01/30/20 0003

## 2020-01-29 NOTE — ED Triage Notes (Signed)
Pt arrives to ED with c/co of early pregnancy and vaginal spotting and lower cramping. LMP: 12/28/10  +pregnancy test on 01/26/10 via home test.

## 2020-01-30 NOTE — ED Notes (Signed)
LWBS 

## 2020-03-27 DIAGNOSIS — Z1509 Genetic susceptibility to other malignant neoplasm: Secondary | ICD-10-CM | POA: Insufficient documentation

## 2020-03-27 DIAGNOSIS — Z1501 Genetic susceptibility to malignant neoplasm of breast: Secondary | ICD-10-CM | POA: Insufficient documentation

## 2020-04-22 LAB — OB RESULTS CONSOLE HEPATITIS B SURFACE ANTIGEN: Hepatitis B Surface Ag: NEGATIVE

## 2020-04-22 LAB — OB RESULTS CONSOLE RUBELLA ANTIBODY, IGM: Rubella: IMMUNE

## 2020-04-22 LAB — OB RESULTS CONSOLE RPR: RPR: NONREACTIVE

## 2020-07-14 ENCOUNTER — Other Ambulatory Visit: Payer: Self-pay | Admitting: Obstetrics and Gynecology

## 2020-07-14 DIAGNOSIS — O0992 Supervision of high risk pregnancy, unspecified, second trimester: Secondary | ICD-10-CM

## 2020-07-16 ENCOUNTER — Encounter: Payer: Self-pay | Admitting: *Deleted

## 2020-07-17 ENCOUNTER — Other Ambulatory Visit: Payer: Self-pay

## 2020-07-23 ENCOUNTER — Encounter: Payer: Self-pay | Admitting: *Deleted

## 2020-07-23 ENCOUNTER — Ambulatory Visit: Payer: No Typology Code available for payment source | Attending: Obstetrics and Gynecology

## 2020-07-23 ENCOUNTER — Ambulatory Visit: Payer: No Typology Code available for payment source | Admitting: *Deleted

## 2020-07-23 ENCOUNTER — Other Ambulatory Visit: Payer: Self-pay | Admitting: *Deleted

## 2020-07-23 ENCOUNTER — Other Ambulatory Visit: Payer: Self-pay

## 2020-07-23 ENCOUNTER — Ambulatory Visit (HOSPITAL_BASED_OUTPATIENT_CLINIC_OR_DEPARTMENT_OTHER): Payer: No Typology Code available for payment source | Admitting: Obstetrics and Gynecology

## 2020-07-23 VITALS — BP 97/53 | HR 72

## 2020-07-23 DIAGNOSIS — O09522 Supervision of elderly multigravida, second trimester: Secondary | ICD-10-CM | POA: Diagnosis not present

## 2020-07-23 DIAGNOSIS — O289 Unspecified abnormal findings on antenatal screening of mother: Secondary | ICD-10-CM | POA: Insufficient documentation

## 2020-07-23 DIAGNOSIS — O43192 Other malformation of placenta, second trimester: Secondary | ICD-10-CM

## 2020-07-23 DIAGNOSIS — O358XX0 Maternal care for other (suspected) fetal abnormality and damage, not applicable or unspecified: Secondary | ICD-10-CM | POA: Diagnosis not present

## 2020-07-23 DIAGNOSIS — O0992 Supervision of high risk pregnancy, unspecified, second trimester: Secondary | ICD-10-CM | POA: Diagnosis present

## 2020-07-23 NOTE — Progress Notes (Signed)
Maternal-Fetal Medicine   Name: Sandra Petty DOB: 21-May-1985 MRN: [id number] Referring Provider: Belva Agee, MD  I had the pleasure of seeing [name] [facility] at [facility].  She is G4 P1 at 21w 2d gestation and is here for a second-opinion ultrasound.  At your office ultrasound unilateral choroid plexus cyst versus intracranial hemorrhage was suspected.  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  Obstetric history significant for a term vaginal delivery in 2019 of a female infant.  Her son is in good health. Her prenatal course has been uneventful.  Advanced maternal age. On cell free fetal DNA screening, she had low risk for fetal aneuploidies.  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  The nuchal translucency measurement was 1.1 mm on first trimester ultrasound.  Her pregnancy is well dated by LMP that is consistent with first trimester ultrasound dating.  Patient reports no chronic medical conditions.  Blood pressure today at her office is 97/53 mmHg.  Ultrasound We performed a fetal anatomy scan.  Amniotic fluid is normal and good fetal activity seen.  Fetal biometry is consistent with the previously established dates.  Findings include: -Marginal cord insertion. 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  The ductus arteriosus appears to be on the left side (normal).  The rest of the cardiac anatomy appears normal. -No other obvious fetal structural defects are seen.  No evidence of choroid plexus cysts. I have reassured her of normal intracranial anatomy.  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  I informed her that it is my strong suspicion and not a confirmatory diagnosis. -I have recommended fetal echocardiography. -Right aortic arch can be associated with chromosomal anomalies including 22 q. 11.2 deletions that can be confirmed only by  amniocentesis.  I informed the patient that we will wait for fetal echocardiography before advising amniocentesis. Marginal cord insertion can be associated for fetal growth restriction in some cases.  However, good fetal outcomes are seen in most pregnancies. We recommend serial fetal growth assessments till delivery.  Recommendations: -We have requested an appointment for fetal echocardiography [facility]). -Fetal growth assessment in 4 weeks and then every 4 weeks till delivery.  Thank you for consultation.  If you have any questions or concerns, please contact me the [facility].  Consultation including face-to-face counseling 30 minutes.

## 2020-07-28 ENCOUNTER — Telehealth: Payer: Self-pay

## 2020-07-28 NOTE — Telephone Encounter (Signed)
FETAL ECHO SCHEDULED FOR 08/07/2020@10A .

## 2020-08-07 DIAGNOSIS — Z803 Family history of malignant neoplasm of breast: Secondary | ICD-10-CM | POA: Insufficient documentation

## 2020-08-21 ENCOUNTER — Ambulatory Visit: Payer: No Typology Code available for payment source | Admitting: *Deleted

## 2020-08-21 ENCOUNTER — Other Ambulatory Visit: Payer: Self-pay

## 2020-08-21 ENCOUNTER — Other Ambulatory Visit: Payer: Self-pay | Admitting: *Deleted

## 2020-08-21 ENCOUNTER — Encounter: Payer: Self-pay | Admitting: *Deleted

## 2020-08-21 ENCOUNTER — Ambulatory Visit: Payer: No Typology Code available for payment source | Attending: Obstetrics and Gynecology

## 2020-08-21 VITALS — BP 101/52 | HR 75

## 2020-08-21 DIAGNOSIS — O09523 Supervision of elderly multigravida, third trimester: Secondary | ICD-10-CM

## 2020-08-21 DIAGNOSIS — O09522 Supervision of elderly multigravida, second trimester: Secondary | ICD-10-CM

## 2020-09-08 LAB — OB RESULTS CONSOLE HIV ANTIBODY (ROUTINE TESTING): HIV: NONREACTIVE

## 2020-09-11 ENCOUNTER — Encounter: Payer: Self-pay | Admitting: Physician Assistant

## 2020-09-11 ENCOUNTER — Telehealth: Payer: No Typology Code available for payment source | Admitting: Physician Assistant

## 2020-09-11 DIAGNOSIS — J019 Acute sinusitis, unspecified: Secondary | ICD-10-CM | POA: Diagnosis not present

## 2020-09-11 DIAGNOSIS — B9689 Other specified bacterial agents as the cause of diseases classified elsewhere: Secondary | ICD-10-CM | POA: Diagnosis not present

## 2020-09-11 DIAGNOSIS — U071 COVID-19: Secondary | ICD-10-CM | POA: Diagnosis not present

## 2020-09-11 MED ORDER — AMOXICILLIN 875 MG PO TABS: 875.0000 mg | ORAL_TABLET | Freq: Two times a day (BID) | ORAL | 0 refills | Status: AC

## 2020-09-11 NOTE — Progress Notes (Signed)
I have spent 5 minutes in review of e-visit questionnaire, review and updating patient chart, medical decision making and response to patient.   Coron Rossano Cody Franchesca Veneziano, PA-C    

## 2020-09-11 NOTE — Progress Notes (Signed)
Virtual Visit Consent   Sandra Petty, you are scheduled for a virtual visit with a [facility] provider today.     Just as with appointments in the office, your consent must be obtained to participate.  Your consent will be active for this visit and any virtual visit you may have with one of our providers in the next 365 days.     If you have a MyChart account, a copy of this consent can be sent to you electronically.  All virtual visits are billed to your insurance company just like a traditional visit in the office.    As this is a virtual visit, video technology does not allow for your provider to perform a traditional examination.  This may limit your provider's ability to fully assess your condition.  If your provider identifies any concerns that need to be evaluated in person or the need to arrange testing (such as labs, EKG, etc.), we will make arrangements to do so.     Although advances in technology are sophisticated, we cannot ensure that it will always work on either your end or our end.  If the connection with a video visit is poor, the visit may have to be switched to a telephone visit.  With either a video or telephone visit, we are not always able to ensure that we have a secure connection.     I need to obtain your verbal consent now.   Are you willing to proceed with your visit today?    Artesha Wemhoff has provided verbal consent on 09/11/2020 for a virtual visit (video or telephone).   Sandra Petty, [place]   Date: 09/11/2020 4:02 PM   Virtual Visit via Video Note   I, Sandra Petty, connected with  Chalee Hirota  ([id number], [birth year]) on 09/11/20 at  3:45 PM EDT by a video-enabled telemedicine application and verified that I am speaking with the correct person using two identifiers.  Location: Patient: Virtual Visit Location Patient: Home Provider: Virtual Visit Location Provider: Home Office   I discussed the limitations of evaluation and management by  telemedicine and the availability of in person appointments. The patient expressed understanding and agreed to proceed.    History of Present Illness: Sandra Petty is a 35 y.o. who identifies as a female who was assigned female at birth, and is being seen today for further assessment regarding COVID. She was seen via e-visit this morning by myself at which time she was given instructions regarding her + COVID test. Giving pregnancy status she was not a candidate for antivirals and it was just recommended for her to reach out to her OB to see if they had any additional treatment they recommended. She states she reached out to them and was told that she could not take antivirals but an antibody infusion was reasonable. She was then told (per patient report) that they could not do this for her and recommended she complete a virtual urgent care visit. Notes her recent symptoms are still mild. Main issue is sinus pain that she is still dealing with x 3.5 weeks from recent cold prior to getting COVID. Again notes nasal and head congestion with sinus pressure and pain. Denis any chest pain or SOB. Some chest congestion now since testing + for COVID.   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 (Patient not taking: Reported on 07/23/2020), Disp: 30 tablet, Rfl: 0   Prenat w/o A-FE-Methfol-FA-DHA (PNV-DHA PO), Take 1 each by mouth daily. , Disp: , Rfl:    Probiotic Product (PROBIOTIC DAILY PO), Take by  mouth., Disp: , Rfl:   Observations/Objective: Patient is well-developed, well-nourished in no acute distress.  Resting comfortably at home.  Head is normocephalic, atraumatic.  No labored breathing. Speech is clear and coherent with logical content.  Patient is alert and oriented at baseline.   Assessment and Plan: 1. Acute bacterial sinusitis - amoxicillin (AMOXIL) 875 MG tablet; Take 1 tablet (875 mg total) by mouth 2 (two) times daily for 10 days.  Dispense: 20 tablet; Refill: 0 Giving ongoing symptoms for 3+ weeks prior to COVID, concern for bacterial sinusitis as well. Rx Amoxicillin.  Increase fluids.  Rest.  Saline nasal spray.  Probiotic.  Mucinex as directed.  Humidifier in bedroom..  Call or return to clinic if symptoms are not improving.  2. COVID-19 Already assessed via e-visit. Supportive measures and OTC medications again reviewed. She is enrolled in symptom monitoring program. She is not a candidate for antivirals giving pregnancy status. Discussed with her that we do not set up infusions in pregnant women and that I would have our director reach out to her OB office to discuss. Giving such mild symptoms I would honestly be more worried about a reaction to the infusion over likely mild benefit of her antibodies especially giving it is muddy how long into COVID she actually is regarding treatment window for mAb.   Follow Up Instructions: I discussed the assessment and treatment plan with the patient. The patient was provided an opportunity to ask questions and all were answered. The patient agreed with the plan and demonstrated an understanding of the instructions.  A copy of instructions were sent to the patient via MyChart.  The patient was advised to call back or seek an in-person evaluation if the symptoms worsen or if the condition fails to improve as anticipated.  Time:  I spent 15 minutes with the patient via telehealth technology discussing the above problems/concerns.     Sandra Climes, PA-C

## 2020-09-11 NOTE — Patient Instructions (Signed)
  [facility] Lance, thank you for joining Piedad Climes, PA-C for today's virtual visit.  While this provider is not your primary care provider (PCP), if your PCP is located in our provider database this encounter information will be shared with them immediately following your visit.  Consent: (Patient) Sandra Petty provided verbal consent for this virtual visit at the beginning of the encounter.  Current Medications:  Current Outpatient Medications:    amoxicillin (AMOXIL) 875 MG tablet, Take 1 tablet (875 mg total) by mouth 2 (two) times daily for 10 days., Disp: 20 tablet, Rfl: 0   ibuprofen (ADVIL,MOTRIN) 600 MG tablet, Take 1 tablet (600 mg total) by mouth every 6 (six) hours. (Patient not taking: Reported on 07/23/2020), Disp: 30 tablet, Rfl: 0   Prenat w/o A-FE-Methfol-FA-DHA (PNV-DHA PO), Take 1 each by mouth daily. , Disp: , Rfl:    Probiotic Product (PROBIOTIC DAILY PO), Take by mouth., Disp: , Rfl:    Medications ordered in this encounter:  Meds ordered this encounter  Medications   amoxicillin (AMOXIL) 875 MG tablet    Sig: Take 1 tablet (875 mg total) by mouth 2 (two) times daily for 10 days.    Dispense:  20 tablet    Refill:  0    Order Specific Question:   Supervising Provider    Answer:   Hyacinth Meeker, BRIAN [[id number]]     *If you need refills on other medications prior to your next appointment, please contact your pharmacy*  Follow-Up: Call back or seek an in-person evaluation if the symptoms worsen or if the condition fails to improve as anticipated.  Other Instructions Please continue care as directed earlier today. I have sent in a script for Amoxicillin to treat the suspected festering sinusitis you have been dealing with the past couple of weeks. I have spoken with my director who is calling your OB to discuss and get them to reach back out to you if they feel additional treatments are needed. Again you have been enrolled in a symptom monitoring program through MyChart so  we can keep track of how you are doing.  If there is any significant windedness or any chest pain, you need ER evaluation.   If you have been instructed to have an in-person evaluation today at a local Urgent Care facility, please use the link below. It will take you to a list of all of our available [facility], including address, phone number and hours of operation. Please do not delay care.  [facility]  If you or a family member do not have a primary care provider, use the link below to schedule a visit and establish care. When you choose a [facility] primary care physician or advanced practice provider, you gain a long-term partner in health. Find a Primary Care Provider  Learn more about 's in-office and virtual care options:  - Get Care Now

## 2020-09-11 NOTE — Progress Notes (Signed)
E-Visit  for Positive Covid Test Result  We are sorry you are not feeling well. We are here to help!  You have tested positive for COVID-19, meaning that you were infected with the novel coronavirus and could give the virus to others.  It is vitally important that you stay home so you do not spread it to others.      Please continue isolation at home, for at least 10 days since the start of your symptoms and until you have had 24 hours with no fever (without taking a fever reducer) and with improving of symptoms.  If you have no symptoms but tested positive (or all symptoms resolve after 5 days and you have no fever) you can leave your house but continue to wear a mask around others for an additional 5 days. If you have a fever,continue to stay home until you have had 24 hours of no fever. Most cases improve 5-10 days from onset but we have seen a small number of patients who have gotten worse after the 10 days.  Please be sure to watch for worsening symptoms and remain taking the proper precautions.   Go to the nearest hospital ED for assessment if fever/cough/breathlessness are severe or illness seems like a threat to life.    The following symptoms may appear 2-14 days after exposure: Fever Cough Shortness of breath or difficulty breathing Chills Repeated shaking with chills Muscle pain Headache Sore throat New loss of taste or smell Fatigue Congestion or runny nose Nausea or vomiting Diarrhea  You have been enrolled in MyChart Home Monitoring for COVID-19. Daily you will receive a questionnaire within the MyChart website. Our COVID-19 response team will be monitoring your responses daily.  You may also take acetaminophen (Tylenol) as needed for fever.  Robitussin for cough. You can consider addition of Claritin to help with any nasal congestion symptoms as well. I recommend you contact your OB to let them know you are COVID + so they can offer you other input on treatments during  pregnancy.   HOME CARE: Only take medications as instructed by your medical team. Drink plenty of fluids and get plenty of rest. A steam or ultrasonic humidifier can help if you have congestion.   GET HELP RIGHT AWAY IF YOU HAVE EMERGENCY WARNING SIGNS.  Call 911 or proceed to your closest emergency facility if: You develop worsening high fever. Trouble breathing Bluish lips or face Persistent pain or pressure in the chest New confusion Inability to wake or stay awake You cough up blood. Your symptoms become more severe Inability to hold down food or fluids  This list is not all possible symptoms. Contact your medical provider for any symptoms that are severe or concerning to you.    Your e-visit answers were reviewed by a board certified advanced clinical practitioner to complete your personal care plan.  Depending on the condition, your plan could have included both over the counter or prescription medications.  If there is a problem please reply once you have received a response from your provider.  Your safety is important to [place].  If you have drug allergies check your prescription carefully.    You can use MyChart to ask questions about today's visit, request a non-urgent call back, or ask for a work or school excuse for 24 hours related to this e-Visit. If it has been greater than 24 hours you will need to follow up with your provider, or enter a new e-Visit  to address those concerns. You will get an e-mail in the next two days asking about your experience.  I hope that your e-visit has been valuable and will speed your recovery. Thank you for using e-visits.

## 2020-09-29 ENCOUNTER — Ambulatory Visit: Payer: No Typology Code available for payment source

## 2020-10-02 ENCOUNTER — Other Ambulatory Visit: Payer: Self-pay

## 2020-10-02 ENCOUNTER — Encounter (HOSPITAL_COMMUNITY): Payer: Self-pay | Admitting: Obstetrics and Gynecology

## 2020-10-02 ENCOUNTER — Inpatient Hospital Stay (HOSPITAL_COMMUNITY)
Admission: AD | Admit: 2020-10-02 | Discharge: 2020-10-02 | Disposition: A | Discharge status: Home / Self Care | Payer: No Typology Code available for payment source | Attending: Obstetrics and Gynecology | Admitting: Obstetrics and Gynecology

## 2020-10-02 DIAGNOSIS — W228XXA Striking against or struck by other objects, initial encounter: Secondary | ICD-10-CM | POA: Diagnosis not present

## 2020-10-02 DIAGNOSIS — O9A213 Injury, poisoning and certain other consequences of external causes complicating pregnancy, third trimester: Secondary | ICD-10-CM | POA: Insufficient documentation

## 2020-10-02 DIAGNOSIS — O09523 Supervision of elderly multigravida, third trimester: Secondary | ICD-10-CM | POA: Diagnosis not present

## 2020-10-02 DIAGNOSIS — Z3A31 31 weeks gestation of pregnancy: Secondary | ICD-10-CM | POA: Insufficient documentation

## 2020-10-02 DIAGNOSIS — O99891 Other specified diseases and conditions complicating pregnancy: Secondary | ICD-10-CM

## 2020-10-02 DIAGNOSIS — Z3689 Encounter for other specified antenatal screening: Secondary | ICD-10-CM | POA: Diagnosis not present

## 2020-10-02 DIAGNOSIS — S3991XA Unspecified injury of abdomen, initial encounter: Secondary | ICD-10-CM | POA: Insufficient documentation

## 2020-10-02 NOTE — MAU Note (Signed)
Pt stated around 1200pm  she was closing the door to her car and hit her stomach hard with the door on accident. At the time her stomach hurt for a little bit but not having pain now. The baby did move when she got hit but she is worried because the baby has a potential aortic arch defect that might require surgery after birth. Wanted o make sure baby was ok and the door hitting did not hurt the baby. Reports she has felt baby move since the incident but not asa much as usual. Denies  any vag bleeding or leaking and does not have any pain or cramping now.

## 2020-10-02 NOTE — MAU Provider Note (Signed)
History     CSN: [id number]  Arrival date and time: 10/02/20 1542   Event Date/Time   First Provider Initiated Contact with Patient 10/02/20 1740      Chief Complaint  Patient presents with   Abdominal Injury   HPI  [name] is a 35 y.o. female [redacted]w[redacted]d @ [phone] here in MAU after an injury that occurred today around 1200 noon. She was closing the back door of her vehicle and the door scraped along the left side of her abdomen. She had pain initially, however she has no pain now. She has no pain or bleeding now. + fetal movement. She reports no known injury to her abdomen.   OB History     Gravida  4   Para  1   Term  1   Preterm      AB  2   Living  1      SAB      IAB      Ectopic      Multiple  0   Live Births  1           Past Medical History:  Diagnosis Date   Anxiety    Depression    Right ovarian cyst    Vaginal Pap smear, abnormal     Past Surgical History:  Procedure Laterality Date   BREAST SURGERY     WISDOM TOOTH EXTRACTION      Family History  Problem Relation Age of Onset   Cancer Mother    Cancer Paternal Grandmother    Cancer Paternal Grandfather     Social History   Tobacco Use   Smoking status: Former    Packs/day: 0.25    Types: Cigarettes    Quit date: 11/04/2008    Years since quitting: 11.9   Smokeless tobacco: Never  Vaping Use   Vaping Use: Never used  Substance Use Topics   Alcohol use: Yes    Comment: wine with dinner    Drug use: No    Allergies: No Known Allergies  Medications Prior to Admission  Medication Sig Dispense Refill Last Dose   Prenat w/o A-FE-Methfol-FA-DHA (PNV-DHA PO) Take 1 each by mouth daily.    10/02/2020   Probiotic Product (PROBIOTIC DAILY PO) Take by mouth.   10/02/2020   ibuprofen (ADVIL,MOTRIN) 600 MG tablet Take 1 tablet (600 mg total) by mouth every 6 (six) hours. (Patient not taking: Reported on 07/23/2020) 30 tablet 0    No results found for this or any previous visit  (from the past 48 hour(s)).   Review of Systems  Gastrointestinal:  Negative for abdominal pain.  Genitourinary:  Negative for vaginal bleeding and vaginal discharge.  Physical Exam   Blood pressure 107/61, pulse 77, temperature 98.2 F (36.8 C), resp. rate 18, last menstrual period 02/25/2020, unknown if currently breastfeeding.  Physical Exam Vitals and nursing note reviewed.  Constitutional:      General: She is not in acute distress.    Appearance: Normal appearance. She is not ill-appearing, toxic-appearing or diaphoretic.  HENT:     Head: Normocephalic.  Eyes:     Pupils: Pupils are equal, round, and reactive to light.  Abdominal:     Palpations: Abdomen is soft.     Tenderness: There is no abdominal tenderness.  Neurological:     Mental Status: She is alert and oriented to person, place, and time.  Psychiatric:        Behavior: Behavior normal.  Fetal Tracing: Baseline: 135 bpm Variability: Moderate  Accelerations: 15x15 Decelerations: None Toco: 1 contraction noted   MAU Course  Procedures  MDM  NST reactive/reassuring. Patient is without pain Reviewed patient with Dr. Vergie Living   Assessment and Plan   A:  1. Traumatic injury during pregnancy in third trimester   2. [redacted] weeks gestation of pregnancy   3. NST (non-stress test) reactive      P:  Discharge home in stable condition Return to MAU if symptoms worsen Reasons to return were reviewed Kick counts   Daneli Butkiewicz, Harolyn Rutherford, NP 10/02/2020 6:33 PM

## 2020-11-04 LAB — OB RESULTS CONSOLE GBS: GBS: NEGATIVE

## 2020-11-28 ENCOUNTER — Inpatient Hospital Stay (EMERGENCY_DEPARTMENT_HOSPITAL)
Admission: AD | Admit: 2020-11-28 | Discharge: 2020-11-28 | Disposition: A | Discharge status: Home / Self Care | Payer: No Typology Code available for payment source | Source: Ambulatory Visit | Attending: Obstetrics and Gynecology | Admitting: Obstetrics and Gynecology

## 2020-11-28 ENCOUNTER — Encounter (HOSPITAL_COMMUNITY): Payer: Self-pay | Admitting: Obstetrics and Gynecology

## 2020-11-28 ENCOUNTER — Other Ambulatory Visit: Payer: Self-pay

## 2020-11-28 DIAGNOSIS — O471 False labor at or after 37 completed weeks of gestation: Secondary | ICD-10-CM | POA: Insufficient documentation

## 2020-11-28 DIAGNOSIS — Z3A39 39 weeks gestation of pregnancy: Secondary | ICD-10-CM | POA: Insufficient documentation

## 2020-11-28 DIAGNOSIS — O26893 Other specified pregnancy related conditions, third trimester: Secondary | ICD-10-CM

## 2020-11-28 DIAGNOSIS — Z3493 Encounter for supervision of normal pregnancy, unspecified, third trimester: Secondary | ICD-10-CM

## 2020-11-28 DIAGNOSIS — O43123 Velamentous insertion of umbilical cord, third trimester: Secondary | ICD-10-CM | POA: Diagnosis not present

## 2020-11-28 DIAGNOSIS — Z3689 Encounter for other specified antenatal screening: Secondary | ICD-10-CM

## 2020-11-28 LAB — POCT FERN TEST: POCT Fern Test: NEGATIVE

## 2020-11-28 NOTE — MAU Provider Note (Signed)
S: Ms. Sandra Petty is a 35 y.o. [phone] at [redacted]w[redacted]d  who presents to MAU today complaining of leaking of fluid since last night. She felt a small gush of something clear and wet prior to bed, then again this morning when she got up and one other small episode while doing her morning routine. None since, not wearing a pad and has not soaked anything. No IC since last week. She denies vaginal bleeding. She denies contractions. She reports normal fetal movement.    O: BP (!) 112/59 (BP Location: Right Arm)   Pulse 81   Temp 98.3 F (36.8 C) (Oral)   Resp 16   Ht 5\' 4"  (1.626 m)   Wt 168 lb 11.2 oz (76.5 kg)   LMP 02/25/2020   SpO2 98% Comment: room air  BMI 28.96 kg/m  GENERAL: Well-developed, well-nourished female in no acute distress.  HEAD: Normocephalic, atraumatic.  CHEST: Normal effort of breathing, regular heart rate ABDOMEN: Soft, nontender, gravid PELVIC: Normal external female genitalia. Vagina is pink and rugated. Cervix with normal contour, no lesions. Normal discharge.  No pooling.   Cervical exam:  Dilation: 2 Effacement (%): 70 Exam by:: [email or web address], CNM (Unchanged from her last exam in the office)  Fetal Monitoring: reactive Baseline: 135 Variability: moderate Accelerations: 15x15 Decelerations: none Contractions: irregular and mild, q5-54min  Results for orders placed or performed during the hospital encounter of 11/28/20 (from the past 24 hour(s))  POCT fern test     Status: None   Collection Time: 11/28/20  6:55 PM  Result Value Ref Range   POCT Fern Test Negative = intact amniotic membranes    Reassurance given, explained how cervical mucus gets thinner as labor approaches and discussed warning signs of labor/SROM.  A: SIUP at [redacted]w[redacted]d  Membranes intact NST reactive with vigorous fetal movement  P: Discharged to home in stable condition with term labor precautions Follow up at [facility] as scheduled for ongoing prenatal care  [redacted]w[redacted]d, Sandra Petty 11/28/2020 7:02 PM

## 2020-11-28 NOTE — MAU Note (Signed)
Sandra Petty is a 35 y.o. at [redacted]w[redacted]d here in MAU reporting: last night felt like she was leaking some fluid. And then this morning felt a little bit more. Has not felt anymore since then but was advised to come in. No contractions. No bleeding. +FM  Onset of complaint: last night  Pain score: 0/10  Vitals:   11/28/20 1743  BP: (!) 112/59  Pulse: 81  Resp: 16  Temp: 98.3 F (36.8 C)  SpO2: 98%     FHT:147  Lab orders placed from triage: none

## 2020-12-01 ENCOUNTER — Inpatient Hospital Stay (HOSPITAL_COMMUNITY): Payer: No Typology Code available for payment source | Admitting: Anesthesiology

## 2020-12-01 ENCOUNTER — Inpatient Hospital Stay (HOSPITAL_COMMUNITY)
Admission: AD | Admit: 2020-12-01 | Discharge: 2020-12-03 | DRG: 807 | Disposition: A | Discharge status: Home / Self Care | Payer: No Typology Code available for payment source | Attending: Obstetrics and Gynecology | Admitting: Obstetrics and Gynecology

## 2020-12-01 ENCOUNTER — Encounter (HOSPITAL_COMMUNITY): Payer: Self-pay | Admitting: Obstetrics and Gynecology

## 2020-12-01 ENCOUNTER — Other Ambulatory Visit: Payer: Self-pay

## 2020-12-01 DIAGNOSIS — O358XX0 Maternal care for other (suspected) fetal abnormality and damage, not applicable or unspecified: Secondary | ICD-10-CM | POA: Diagnosis present

## 2020-12-01 DIAGNOSIS — Z3A40 40 weeks gestation of pregnancy: Secondary | ICD-10-CM | POA: Diagnosis not present

## 2020-12-01 DIAGNOSIS — Z20822 Contact with and (suspected) exposure to covid-19: Secondary | ICD-10-CM | POA: Diagnosis present

## 2020-12-01 DIAGNOSIS — Z87891 Personal history of nicotine dependence: Secondary | ICD-10-CM

## 2020-12-01 DIAGNOSIS — O43123 Velamentous insertion of umbilical cord, third trimester: Secondary | ICD-10-CM | POA: Diagnosis present

## 2020-12-01 DIAGNOSIS — O26893 Other specified pregnancy related conditions, third trimester: Secondary | ICD-10-CM | POA: Diagnosis present

## 2020-12-01 DIAGNOSIS — Z3490 Encounter for supervision of normal pregnancy, unspecified, unspecified trimester: Secondary | ICD-10-CM

## 2020-12-01 LAB — TYPE AND SCREEN
ABO/RH(D): A NEG
Antibody Screen: POSITIVE

## 2020-12-01 LAB — RESP PANEL BY RT-PCR (FLU A&B, COVID) ARPGX2
Influenza A by PCR: NEGATIVE
Influenza B by PCR: NEGATIVE
SARS Coronavirus 2 by RT PCR: NEGATIVE

## 2020-12-01 LAB — CBC
HCT: 37.5 % (ref 36.0–46.0)
Hemoglobin: 12.6 g/dL (ref 12.0–15.0)
MCH: 31.7 pg (ref 26.0–34.0)
MCHC: 33.6 g/dL (ref 30.0–36.0)
MCV: 94.5 fL (ref 80.0–100.0)
Platelets: 202 10*3/uL (ref 150–400)
RBC: 3.97 MIL/uL (ref 3.87–5.11)
RDW: 12.3 % (ref 11.5–15.5)
WBC: 10.8 10*3/uL — ABNORMAL HIGH (ref 4.0–10.5)
nRBC: 0 % (ref 0.0–0.2)

## 2020-12-01 MED ORDER — FENTANYL-BUPIVACAINE-NACL 0.5-0.125-0.9 MG/250ML-% EP SOLN
EPIDURAL | Status: AC
  Filled 2020-12-01: qty 250

## 2020-12-01 MED ORDER — OXYTOCIN BOLUS FROM INFUSION
333.0000 mL | Freq: Once | INTRAVENOUS | Status: AC
  Administered 2020-12-02: 333 mL via INTRAVENOUS

## 2020-12-01 MED ORDER — EPHEDRINE 5 MG/ML INJ: 10.0000 mg | INTRAVENOUS | Status: DC | PRN

## 2020-12-01 MED ORDER — ONDANSETRON HCL 4 MG/2ML IJ SOLN: 4.0000 mg | Freq: Four times a day (QID) | INTRAMUSCULAR | Status: DC | PRN

## 2020-12-01 MED ORDER — SOD CITRATE-CITRIC ACID 500-334 MG/5ML PO SOLN: 30.0000 mL | ORAL | Status: DC | PRN

## 2020-12-01 MED ORDER — PHENYLEPHRINE 40 MCG/ML (10ML) SYRINGE FOR IV PUSH (FOR BLOOD PRESSURE SUPPORT): 80.0000 ug | PREFILLED_SYRINGE | INTRAVENOUS | Status: DC | PRN

## 2020-12-01 MED ORDER — LACTATED RINGERS IV SOLN
500.0000 mL | Freq: Once | INTRAVENOUS | Status: AC
  Administered 2020-12-01: 500 mL via INTRAVENOUS

## 2020-12-01 MED ORDER — OXYTOCIN-SODIUM CHLORIDE 30-0.9 UT/500ML-% IV SOLN
2.5000 [IU]/h | INTRAVENOUS | Status: DC
  Administered 2020-12-02: 2.5 [IU]/h via INTRAVENOUS
  Filled 2020-12-01: qty 500

## 2020-12-01 MED ORDER — FLEET ENEMA 7-19 GM/118ML RE ENEM: 1.0000 | ENEMA | RECTAL | Status: DC | PRN

## 2020-12-01 MED ORDER — LIDOCAINE HCL (PF) 1 % IJ SOLN: 30.0000 mL | INTRAMUSCULAR | Status: DC | PRN

## 2020-12-01 MED ORDER — LIDOCAINE HCL (PF) 1 % IJ SOLN
INTRAMUSCULAR | Status: DC | PRN
  Administered 2020-12-01: 11 mL via EPIDURAL

## 2020-12-01 MED ORDER — OXYCODONE-ACETAMINOPHEN 5-325 MG PO TABS: 2.0000 | ORAL_TABLET | ORAL | Status: DC | PRN

## 2020-12-01 MED ORDER — PHENYLEPHRINE 40 MCG/ML (10ML) SYRINGE FOR IV PUSH (FOR BLOOD PRESSURE SUPPORT)
PREFILLED_SYRINGE | INTRAVENOUS | Status: AC
  Filled 2020-12-01: qty 10

## 2020-12-01 MED ORDER — FENTANYL-BUPIVACAINE-NACL 0.5-0.125-0.9 MG/250ML-% EP SOLN
12.0000 mL/h | EPIDURAL | Status: DC | PRN
  Administered 2020-12-01: 12 mL/h via EPIDURAL

## 2020-12-01 MED ORDER — FENTANYL CITRATE (PF) 100 MCG/2ML IJ SOLN
50.0000 ug | INTRAMUSCULAR | Status: DC | PRN
  Administered 2020-12-01 (×2): 50 ug via INTRAVENOUS
  Administered 2020-12-01: 100 ug via INTRAVENOUS
  Filled 2020-12-01 (×3): qty 2

## 2020-12-01 MED ORDER — OXYCODONE-ACETAMINOPHEN 5-325 MG PO TABS: 1.0000 | ORAL_TABLET | ORAL | Status: DC | PRN

## 2020-12-01 MED ORDER — DIPHENHYDRAMINE HCL 50 MG/ML IJ SOLN: 12.5000 mg | INTRAMUSCULAR | Status: DC | PRN

## 2020-12-01 MED ORDER — ACETAMINOPHEN 325 MG PO TABS: 650.0000 mg | ORAL_TABLET | ORAL | Status: DC | PRN

## 2020-12-01 MED ORDER — LACTATED RINGERS IV SOLN: INTRAVENOUS | Status: DC

## 2020-12-01 MED ORDER — LACTATED RINGERS IV SOLN: 500.0000 mL | INTRAVENOUS | Status: DC | PRN

## 2020-12-01 NOTE — MAU Note (Signed)
Present with c/o ctxs 3 minutes apart.  Denies VB or LOF.  Endorses +FM, less than usual.

## 2020-12-01 NOTE — H&P (Signed)
Sandra Petty is a 35 y.o. female presenting for UCs. 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 U/S this pregnancy @ 39 6/7wks-EFW 7# 15oz.  OB History     Gravida  4   Para  1   Term  1   Preterm      AB  2   Living  1      SAB      IAB      Ectopic      Multiple  0   Live Births  1          Past Medical History:  Diagnosis Date   Anxiety    Depression    Right ovarian cyst    Vaginal Pap smear, abnormal    Past Surgical History:  Procedure Laterality Date   BREAST SURGERY     WISDOM TOOTH EXTRACTION     Family History: family history includes Cancer in her mother, paternal grandfather, and paternal grandmother. Social History:  reports that she quit smoking about 12 years ago. Her smoking use included cigarettes. She smoked an average of .25 packs per day. She has never used smokeless tobacco. She reports current alcohol use. She reports that she does not use drugs.     Maternal Diabetes: No Genetic Screening: Normal Maternal Ultrasounds/Referrals: Other:marginal cord insertion Fetal Ultrasounds or other Referrals:  Fetal echo, Other: right sided aortic arch Maternal Substance Abuse:  No Significant Maternal Medications:  None Significant Maternal Lab Results:  Group B Strep negative Other Comments:  None  Review of Systems  Constitutional:  Negative for fever.  Eyes:  Negative for visual disturbance.  Neurological:  Negative for headaches.  Maternal Medical History:  Reason for admission: Contractions.   Fetal activity: Perceived fetal activity is normal.    Dilation: 5 Effacement (%): 100 Station: 0 Exam by:: Ceciley Buist, MD Blood pressure 119/63, pulse 62, temperature 97.7 F (36.5 C), temperature source Oral, resp. rate 20, height 5\' 5"  (1.651 m),  weight 76.6 kg, last menstrual period 02/25/2020, unknown if currently breastfeeding. Maternal Exam:  Abdomen: Fetal presentation: vertex   Fetal Exam Fetal State Assessment: Category I - tracings are normal.  Physical Exam Cardiovascular:     Rate and Rhythm: Normal rate.  Pulmonary:     Effort: Pulmonary effort is normal.    Prenatal labs: ABO, Rh: --/--/A NEG (11/15 1845) Antibody: POS (11/15 1845) Rubella:   RPR:    HBsAg:    HIV:    GBS:   negative 11/04/20  Assessment/Plan: 35 yo G4P1 @ 40 0/7 weeks in active labor She has been counseled extensively in the office regarding possible recurrent shoulder dystocia and risks for the baby and for her.   02-23-2006 II 12/01/2020, 9:55 PM

## 2020-12-01 NOTE — Anesthesia Preprocedure Evaluation (Signed)
Anesthesia Evaluation  Patient identified by MRN, date of birth, ID band Patient awake    Reviewed: Allergy & Precautions, NPO status , Patient's Chart, lab work & pertinent test results  Airway Mallampati: II  TM Distance: >3 FB Neck ROM: Full    Dental no notable dental hx.    Pulmonary neg pulmonary ROS, former smoker,    Pulmonary exam normal breath sounds clear to auscultation       Cardiovascular negative cardio ROS Normal cardiovascular exam Rhythm:Regular Rate:Normal     Neuro/Psych Anxiety Depression negative neurological ROS  negative psych ROS   GI/Hepatic negative GI ROS, Neg liver ROS,   Endo/Other  negative endocrine ROS  Renal/GU negative Renal ROS  negative genitourinary   Musculoskeletal negative musculoskeletal ROS (+)   Abdominal   Peds negative pediatric ROS (+)  Hematology negative hematology ROS (+)   Anesthesia Other Findings   Reproductive/Obstetrics (+) Pregnancy                             Anesthesia Physical  Anesthesia Plan  ASA: II  Anesthesia Plan: Epidural   Post-op Pain Management:    Induction:   PONV Risk Score and Plan:   Airway Management Planned:   Additional Equipment:   Intra-op Plan:   Post-operative Plan:   Informed Consent: I have reviewed the patients History and Physical, chart, labs and discussed the procedure including the risks, benefits and alternatives for the proposed anesthesia with the patient or authorized representative who has indicated his/her understanding and acceptance.       Plan Discussed with:   Anesthesia Plan Comments:         Anesthesia Quick Evaluation

## 2020-12-01 NOTE — Anesthesia Procedure Notes (Signed)
Epidural Patient location during procedure: OB Start time: 12/01/2020 9:56 PM End time: 12/01/2020 10:13 PM  Staffing Anesthesiologist: Lowella Curb, MD Performed: anesthesiologist   Preanesthetic Checklist Completed: patient identified, IV checked, site marked, risks and benefits discussed, surgical consent, monitors and equipment checked, pre-op evaluation and timeout performed  Epidural Patient position: sitting Prep: ChloraPrep Patient monitoring: heart rate, cardiac monitor, continuous pulse ox and blood pressure Approach: midline Injection technique: LOR air  Needle:  Needle type: Tuohy  Needle gauge: 17 G Needle length: 9 cm Needle insertion depth: 5 cm Catheter type: closed end flexible Catheter size: 20 Guage Catheter at skin depth: 9 cm Test dose: negative  Assessment Events: blood not aspirated, injection not painful, no injection resistance, no paresthesia and negative IV test  Additional Notes Reason for block:procedure for pain

## 2020-12-02 ENCOUNTER — Encounter (HOSPITAL_COMMUNITY): Payer: Self-pay | Admitting: Obstetrics and Gynecology

## 2020-12-02 LAB — CBC
HCT: 34.4 % — ABNORMAL LOW (ref 36.0–46.0)
Hemoglobin: 11.6 g/dL — ABNORMAL LOW (ref 12.0–15.0)
MCH: 31.9 pg (ref 26.0–34.0)
MCHC: 33.7 g/dL (ref 30.0–36.0)
MCV: 94.5 fL (ref 80.0–100.0)
Platelets: 211 10*3/uL (ref 150–400)
RBC: 3.64 MIL/uL — ABNORMAL LOW (ref 3.87–5.11)
RDW: 12.2 % (ref 11.5–15.5)
WBC: 19.8 10*3/uL — ABNORMAL HIGH (ref 4.0–10.5)
nRBC: 0 % (ref 0.0–0.2)

## 2020-12-02 LAB — RPR: RPR Ser Ql: NONREACTIVE

## 2020-12-02 MED ORDER — WITCH HAZEL-GLYCERIN EX PADS: 1.0000 "application " | MEDICATED_PAD | CUTANEOUS | Status: DC | PRN

## 2020-12-02 MED ORDER — BENZOCAINE-MENTHOL 20-0.5 % EX AERO
1.0000 "application " | INHALATION_SPRAY | CUTANEOUS | Status: DC | PRN
  Filled 2020-12-02: qty 56

## 2020-12-02 MED ORDER — PRENATAL MULTIVITAMIN CH
1.0000 | ORAL_TABLET | Freq: Every day | ORAL | Status: DC
  Administered 2020-12-02 – 2020-12-03 (×2): 1 via ORAL
  Filled 2020-12-02 (×2): qty 1

## 2020-12-02 MED ORDER — ZOLPIDEM TARTRATE 5 MG PO TABS: 5.0000 mg | ORAL_TABLET | Freq: Every evening | ORAL | Status: DC | PRN

## 2020-12-02 MED ORDER — DIBUCAINE (PERIANAL) 1 % EX OINT: 1.0000 "application " | TOPICAL_OINTMENT | CUTANEOUS | Status: DC | PRN

## 2020-12-02 MED ORDER — ONDANSETRON HCL 4 MG PO TABS: 4.0000 mg | ORAL_TABLET | ORAL | Status: DC | PRN

## 2020-12-02 MED ORDER — OXYCODONE HCL 5 MG PO TABS: 10.0000 mg | ORAL_TABLET | ORAL | Status: DC | PRN

## 2020-12-02 MED ORDER — IBUPROFEN 600 MG PO TABS
600.0000 mg | ORAL_TABLET | Freq: Four times a day (QID) | ORAL | Status: DC
  Administered 2020-12-02 – 2020-12-03 (×6): 600 mg via ORAL
  Filled 2020-12-02 (×6): qty 1

## 2020-12-02 MED ORDER — SIMETHICONE 80 MG PO CHEW: 80.0000 mg | CHEWABLE_TABLET | ORAL | Status: DC | PRN

## 2020-12-02 MED ORDER — SENNOSIDES-DOCUSATE SODIUM 8.6-50 MG PO TABS
2.0000 | ORAL_TABLET | ORAL | Status: DC
  Administered 2020-12-02 – 2020-12-03 (×2): 2 via ORAL
  Filled 2020-12-02 (×2): qty 2

## 2020-12-02 MED ORDER — DIPHENHYDRAMINE HCL 25 MG PO CAPS: 25.0000 mg | ORAL_CAPSULE | Freq: Four times a day (QID) | ORAL | Status: DC | PRN

## 2020-12-02 MED ORDER — COCONUT OIL OIL: 1.0000 "application " | TOPICAL_OIL | Status: DC | PRN

## 2020-12-02 MED ORDER — TETANUS-DIPHTH-ACELL PERTUSSIS 5-2.5-18.5 LF-MCG/0.5 IM SUSY: 0.5000 mL | PREFILLED_SYRINGE | Freq: Once | INTRAMUSCULAR | Status: DC

## 2020-12-02 MED ORDER — OXYCODONE HCL 5 MG PO TABS
5.0000 mg | ORAL_TABLET | ORAL | Status: DC | PRN
  Administered 2020-12-02: 5 mg via ORAL
  Filled 2020-12-02: qty 1

## 2020-12-02 MED ORDER — ACETAMINOPHEN 325 MG PO TABS
650.0000 mg | ORAL_TABLET | ORAL | Status: DC | PRN
  Administered 2020-12-02: 650 mg via ORAL
  Filled 2020-12-02: qty 2

## 2020-12-02 MED ORDER — ONDANSETRON HCL 4 MG/2ML IJ SOLN: 4.0000 mg | INTRAMUSCULAR | Status: DC | PRN

## 2020-12-02 NOTE — Progress Notes (Signed)
Delivery Note At 12:40 AM a viable female was delivered via Vaginal, Spontaneous (Presentation: Left Occiput Anterior).  APGAR: 7, 9; weight  .   Placenta status: Spontaneous, Intact.  Cord: 3 vessels with the following complications: None.  Cord pH: pending  Anesthesia: Epidural Episiotomy: None Lacerations: 2nd degree ML repaired Suture Repair: 2.0 vicryl rapide Est. Blood Loss (mL): 150  Mom to postpartum.  Baby to Couplet care / Skin to Skin.  Roselle Locus II 12/02/2020, 1:00 AM

## 2020-12-02 NOTE — Lactation Note (Signed)
This note was copied from a baby's chart. Lactation Consultation Note  Patient Name: Sandra Petty [id number] Date: 12/02/2020 Reason for consult: Initial assessment;Term Age:37 hours  LC visit: Mom attempted to latch infant, but he was sleepy & had fed recently. Mom was reminded of sleepy behavior in the 1st 24 hrs of life; parents' questions were answered to their satisfaction.   Mom was made aware of O/P services, breastfeeding support groups, and our phone # for post-discharge questions.   Maternal H&P mentioned breast surgery; RN stated it was a removal of cyst from breast.   Lurline Hare [facility] 12/02/2020, 2:00 PM

## 2020-12-02 NOTE — Plan of Care (Signed)

## 2020-12-02 NOTE — Lactation Note (Signed)
This note was copied from a baby's chart. Lactation Consultation Note  Patient Name: Sandra Petty [id number] Date: 12/02/2020   Age:37 hours  LC visit attempted, but Mom was sleeping. LC to return later.    Lurline Hare [facility] 12/02/2020, 8:23 AM

## 2020-12-02 NOTE — Anesthesia Postprocedure Evaluation (Signed)
Anesthesia Post Note  Patient: Sandra Petty  Procedure(s) Performed: AN AD HOC LABOR EPIDURAL     Patient location during evaluation: Mother Baby Anesthesia Type: Epidural Level of consciousness: awake and alert Pain management: pain level controlled Vital Signs Assessment: post-procedure vital signs reviewed and stable Respiratory status: spontaneous breathing, nonlabored ventilation and respiratory function stable Cardiovascular status: stable Postop Assessment: no headache, no backache and epidural receding Anesthetic complications: no   No notable events documented.  Last Vitals:  Vitals:   12/02/20 0248 12/02/20 0358  BP: 110/75 103/64  Pulse: 85 86  Resp: 18 14  Temp: 36.9 C 37.1 C  SpO2: 100% 99%    Last Pain:  Vitals:   12/02/20 0720  TempSrc:   PainSc: Asleep   Pain Goal:                   Marrion Coy

## 2020-12-02 NOTE — Progress Notes (Signed)
Post Partum Day 0 Subjective: no complaints, up ad lib, voiding, and tolerating PO.  Declines circ.  Objective: Blood pressure 92/64, pulse 71, temperature 99.1 F (37.3 C), temperature source Oral, resp. rate 17, height 5\' 5"  (1.651 m), weight 76.6 kg, last menstrual period 02/25/2020, SpO2 99 %, unknown if currently breastfeeding.  Physical Exam:  General: alert, cooperative, and appears stated age 35: appropriate Uterine Fundus: firm Incision: healing well, no significant drainage, no dehiscence DVT Evaluation: No evidence of DVT seen on physical exam. Negative Homan's sign. No cords or calf tenderness.  Recent Labs    12/01/20 1850 12/02/20 0423  HGB 12.6 11.6*  HCT 37.5 34.4*    Assessment/Plan: Plan for discharge tomorrow and Breastfeeding   LOS: 1 day   12/04/20 12/02/2020, 10:36 AM

## 2020-12-03 MED ORDER — IBUPROFEN 600 MG PO TABS: 600.0000 mg | ORAL_TABLET | Freq: Four times a day (QID) | ORAL | 0 refills | Status: AC

## 2020-12-03 MED ORDER — ACETAMINOPHEN 325 MG PO TABS: 650.0000 mg | ORAL_TABLET | ORAL | 0 refills | Status: AC | PRN

## 2020-12-03 NOTE — Lactation Note (Signed)
This note was copied from a baby's chart. Lactation Consultation Note  Patient Name: Sandra Petty [id number] Date: 12/03/2020 Reason for consult: Follow-up assessment;Mother's request;Term;Breastfeeding assistance Age:37 hours  LC assisted with getting deeper latch with breast compression to soften breasts in midst of feeding. Mom provided with coconut oil moist heat to massage breast to relieve discomfort.  Mom still feeding at end of the visit, stating breast more comfortable.  All questions answered at the end of the visit.   Maternal Data    Feeding Mother's Current Feeding Choice: Breast Milk  LATCH Score Latch: Repeated attempts needed to sustain latch, nipple held in mouth throughout feeding, stimulation needed to elicit sucking reflex.  Audible Swallowing: Spontaneous and intermittent  Type of Nipple: Everted at rest and after stimulation  Comfort (Breast/Nipple): Filling, red/small blisters or bruises, mild/mod discomfort  Hold (Positioning): Assistance needed to correctly position infant at breast and maintain latch.  LATCH Score: 7   Lactation Tools Discussed/Used    Interventions Interventions: Breast feeding basics reviewed;Support pillows;Education;Assisted with latch;Position options;Skin to skin;Expressed milk;Breast massage;Hand express;Infant Driven Feeding Algorithm education;Breast compression;Adjust position  Discharge Discharge Education: Engorgement and breast care;Warning signs for feeding baby Pump: Personal  Consult Status Consult Status: Complete Date: 12/03/20 Follow-up type: In-patient    Delores Thelen  Nicholson-Springer 12/03/2020, 5:36 PM

## 2020-12-03 NOTE — Progress Notes (Signed)
MOB was referred for history of depression/anxiety. * Referral screened out by Clinical Social Worker because none of the following criteria appear to apply: ~ History of anxiety/depression during this pregnancy, or of post-partum depression following prior delivery. Per prenatal records review, no concerns of anxiety and or depression noted.  ~ Diagnosis of anxiety and/or depression within last 3 years. Per chart review, MOB's depression/anxiety dates back to 2016.  OR * MOB's symptoms currently being treated with medication and/or therapy. Please contact the Clinical Social Worker if needs arise, by MOB request, or if MOB scores greater than 9/yes to question 10 on Edinburgh Postpartum Depression Screen.  Anan Dapolito, LCSW Clinical Social Worker [facility] Cell#: [phone] 

## 2020-12-03 NOTE — Discharge Summary (Signed)
Obstetric Discharge Summary  Sandra Petty is a 35 y.o. female that presented on 12/01/2020 for contractions.  She was admitted to labor and delivery for labor.  Her labor course was uncomplicated and she delivered a viable female infant on 12/02/2020.  Her postpartum course was uncomplicated and on PPD#1, she reported well controlled pain, spontaneous voiding, ambulating without difficulty, and tolerating PO.  She was stable for discharge home on 12/03/20 with plans for in-office follow up.  Hemoglobin  Date Value Ref Range Status  12/02/2020 11.6 (L) 12.0 - 15.0 g/dL Final   HCT  Date Value Ref Range Status  12/02/2020 34.4 (L) 36.0 - 46.0 % Final    Physical Exam:  General: alert and no distress Lochia: appropriate Uterine Fundus: firm Incision: healing well DVT Evaluation: No evidence of DVT seen on physical exam.  Discharge Diagnoses: Term Pregnancy-delivered  Discharge Information: Date: 12/03/2020 Activity: Pelvic rest, as tolerated Diet: routine Medications: Tylenol, motrin Condition: stable Instructions: Refer to practice specific booklet.  Discussed prior to discharge.  Discharge to: Home  Follow-up Information     , [facility] Follow up.   Why: Please follow up for 6 week postpartum visit. Contact information: [address] [place] [address] [phone]                 Newborn Data: Live born female  Birth Weight: 9 lb 9.8 oz (4360 g) APGAR: 7, 9  Newborn Delivery   Birth date/time: 12/02/2020 00:40:00 Delivery type: Vaginal, Spontaneous      Home with mother.  Sandra Petty 12/03/2020, 10:37 PM

## 2020-12-03 NOTE — Progress Notes (Signed)
Postpartum Progress Note  Post Partum Day 1 s/p spontaneous vaginal delivery.  Patient reports well-controlled pain, ambulating without difficulty, voiding spontaneously, tolerating PO.  Vaginal bleeding is appropriate.   Objective: Blood pressure 102/64, pulse 60, temperature 98.3 F (36.8 C), temperature source Oral, resp. rate 18, height 5\' 5"  (1.651 m), weight 76.6 kg, last menstrual period 02/25/2020, SpO2 99 %, unknown if currently breastfeeding.  Physical Exam:  General: alert and no distress Lochia: appropriate Uterine Fundus: firm DVT Evaluation: No evidence of DVT seen on physical exam.  Recent Labs    12/01/20 1850 12/02/20 0423  HGB 12.6 11.6*  HCT 37.5 34.4*    Assessment/Plan: Postpartum Day 1, s/p vaginal delivery. Continue routine postpartum care Lactation following Anticipate discharge home later today   LOS: 2 days   12/04/20 12/03/2020, 7:51 AM

## 2020-12-08 ENCOUNTER — Inpatient Hospital Stay (HOSPITAL_COMMUNITY): Payer: No Typology Code available for payment source

## 2020-12-11 ENCOUNTER — Inpatient Hospital Stay (HOSPITAL_COMMUNITY): Payer: No Typology Code available for payment source

## 2020-12-11 ENCOUNTER — Inpatient Hospital Stay (HOSPITAL_COMMUNITY)
Admission: AD | Admit: 2020-12-11 | Payer: No Typology Code available for payment source | Source: Home / Self Care | Admitting: Obstetrics and Gynecology

## 2020-12-15 ENCOUNTER — Telehealth (HOSPITAL_COMMUNITY): Payer: Self-pay | Admitting: *Deleted

## 2020-12-15 NOTE — Telephone Encounter (Signed)
Phone voicemail message left to return nurse call.  Duffy Rhody, RN 12-15-2020 at 3:55pm

## 2020-12-16 ENCOUNTER — Telehealth (HOSPITAL_COMMUNITY): Payer: Self-pay | Admitting: *Deleted

## 2020-12-16 NOTE — Telephone Encounter (Signed)
Mom reports feeling good. No concerns about herself at this time. EPDS=4 [facility] score=2 ) Mom reports baby is doing well. Feeding, peeing, and pooping without difficulty. Mom reports no concerns about baby at present.  Duffy Rhody, RN 12-16-2020 at 1:13pm

## 2021-02-09 ENCOUNTER — Telehealth: Payer: Self-pay

## 2021-02-09 NOTE — Telephone Encounter (Signed)
I saw her husband the other day and I think she preferred to see a female.  I will accept her.

## 2021-02-09 NOTE — Telephone Encounter (Signed)
Patient states provider agreed to accept her as a new patient  Ok to schedule?

## 2021-02-28 NOTE — Progress Notes (Signed)
° ° °Subjective:  ° ° Patient ID: Sandra Petty, female    DOB: 01/12/1986, 36 y.o.   MRN: [id number] ° ° °This visit occurred during the SARS-CoV-2 public health emergency.  Safety protocols were in place, including screening questions prior to the visit, additional usage of staff PPE, and extensive cleaning of exam room while observing appropriate contact time as indicated for disinfecting solutions. ° ° ° °HPI °She is here for a physical exam.  ° ° °She is having fatigue, hair loss.   She gets very tired when doing daily activities.  She gets tired just walking around the store. ° °Has a 3 month old baby at home and a 3-year-old boy.     ° ° °Sleep is an issue - she does snore sometimes.   She wonders if she has sleep apnea-her husband has it.  She probably gets about 7 hrs on average accumulated.  She feels fatigued throughout the day.    ° ° °She sees a chiropractor and pelvic PT.   ° °Medications and allergies reviewed with patient and updated if appropriate. 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 30 tablet 0  ° ibuprofen (ADVIL) 600 MG tablet Take 1 tablet (600 mg total) by mouth every 6 (six) hours. 30 tablet 0  ° Prenat w/o A-FE-Methfol-FA-DHA (PNV-DHA PO) Take 1 each by mouth daily.     ° Probiotic Product (PROBIOTIC DAILY PO) Take by mouth.    ° °No current facility-administered medications on file prior to visit.  ° ° °Past Medical History:  °Diagnosis Date  ° Anxiety   ° Depression   ° Right ovarian cyst   ° Vaginal Pap smear, abnormal   ° ° °Past Surgical History:  °Procedure Laterality Date  °  BREAST SURGERY    ° WISDOM TOOTH EXTRACTION    ° ° °Social History  ° °Socioeconomic History  ° Marital status: Married  °  Spouse name: Not on file  ° Number of children: Not on file  ° Years of education: Not on file  ° Highest education level: Not on file  °Occupational History  ° Not on file  °Tobacco Use  ° Smoking status: Former  °  Packs/day: 0.25  °  Types: Cigarettes  °  Quit date: 11/04/2008  °  Years since quitting: 12.3  ° Smokeless tobacco: Never  °Vaping Use  ° Vaping Use: Never used  °Substance and Sexual Activity  ° Alcohol use: Not Currently  °  Comment: wine with dinner   ° Drug use: No  ° Sexual activity: Yes  °Other Topics Concern  ° Not on file  °Social History Narrative  ° Not on file  ° °Social Determinants of Health  ° °Financial Resource Strain: Not on file  °Food Insecurity: Not on file  °Transportation Needs: Not on file  °Physical Activity: Not on file  °Stress: Not on file  °Social Connections: Not on file  ° ° °Family History  °Problem Relation Age of Onset  ° Cancer Mother   ° Cancer Paternal Grandmother   °   Cancer Paternal Grandfather   ° ° °Review of Systems  °Constitutional:  Negative for chills and fever.  °Eyes:  Negative for visual disturbance.  °Respiratory:  Negative for cough, shortness of breath and wheezing.   °Cardiovascular:  Positive for palpitations (occasional). Negative for chest pain and leg swelling.  °Gastrointestinal:  Negative for abdominal pain, blood in stool, constipation, diarrhea and nausea.  °     No gerd  °Genitourinary:  Negative for dysuria and hematuria.  °Musculoskeletal:  Positive for back pain (lower back - achy, upper back mm tightness). Negative for arthralgias.  °Skin:  Negative for rash.  °     Hair loss before and after birth  °Neurological:  Negative for dizziness, light-headedness and headaches.  °Psychiatric/Behavioral:  Negative for dysphoric mood. The patient is not nervous/anxious.   ° °   °Objective:  ° °Vitals:  ° 03/02/21 1106  °BP:  100/80  °Pulse: 87  °Temp: 98.3 °F (36.8 °C)  °SpO2: 99%  ° °Filed Weights  ° 03/02/21 1106  °Weight: 142 lb (64.4 kg)  ° °Body mass index is 23.63 kg/m². ° °BP Readings from Last 3 Encounters:  °03/02/21 100/80  °12/03/20 102/64  °11/28/20 98/62  ° ° °Wt Readings from Last 3 Encounters:  °03/02/21 142 lb (64.4 kg)  °12/01/20 168 lb 14.4 oz (76.6 kg)  °11/28/20 168 lb 11.2 oz (76.5 kg)  ° ° °No flowsheet data found. ° ° °No flowsheet data found. ° ° ° ° Physical Exam °Constitutional: She appears well-developed and well-nourished. No distress.  °HENT:  °Head: Normocephalic and atraumatic.  °Right Ear: External ear normal. Normal ear canal and TM °Left Ear: External ear normal.  Normal ear canal and TM °Mouth/Throat: Oropharynx is clear and moist.  °Eyes: Conjunctivae and EOM are normal.  °Neck: Neck supple. No tracheal deviation present. No thyromegaly present.  °No carotid bruit  °Cardiovascular: Normal rate, regular rhythm and normal heart sounds.   °No murmur heard.  No edema. °Pulmonary/Chest: Effort normal and breath sounds normal. No respiratory distress. She has no wheezes. She has no rales.  °Breast: deferred   °Abdominal: Soft. She exhibits no distension. There is no tenderness.  °Lymphadenopathy: She has no cervical adenopathy.  °Skin: Skin is warm and dry. She is not diaphoretic.  °Psychiatric: She has a normal mood and affect. Her behavior is normal.  ° ° ° °Lab Results  °Component Value Date  ° WBC 19.8 (H) 12/02/2020  ° HGB 11.6 (L) 12/02/2020  ° HCT 34.4 (L) 12/02/2020  ° PLT 211 12/02/2020  ° ° ° ° °   °Assessment & Plan:  ° °Physical exam: °Screening blood work  ordered °Exercise    not regular °Weight  normal °Substance abuse  none ° ° °Reviewed recommended immunizations. ° ° °Health Maintenance  °Topic Date Due  ° COVID-19 Vaccine (1) Never done  ° Hepatitis C Screening  Never done  ° PAP SMEAR-Modifier  Never done  ° TETANUS/TDAP  09/09/2030  ° INFLUENZA VACCINE  Completed  ° HIV Screening   Completed  ° HPV VACCINES  Aged Out  °  ° ° ° °See Problem List for Assessment and Plan of chronic medical problems. ° ° ° ° ° °

## 2021-02-28 NOTE — Patient Instructions (Addendum)
It was nice to meet you.   Blood work was ordered.     Medications changes include :   none    A referral was ordered for [facility] for evaluation of sleep apnea.     Please followup in 1-2 years for a physical    Health Maintenance, Female Adopting a healthy lifestyle and getting preventive care are important in promoting health and wellness. Ask your health care provider about: The right schedule for you to have regular tests and exams. Things you can do on your own to prevent diseases and keep yourself healthy. What should I know about diet, weight, and exercise? Eat a healthy diet  Eat a diet that includes plenty of vegetables, fruits, low-fat dairy products, and lean protein. Do not eat a lot of foods that are high in solid fats, added sugars, or sodium. Maintain a healthy weight Body mass index (BMI) is used to identify weight problems. It estimates body fat based on height and weight. Your health care provider can help determine your BMI and help you achieve or maintain a healthy weight. Get regular exercise Get regular exercise. This is one of the most important things you can do for your health. Most adults should: Exercise for at least 150 minutes each week. The exercise should increase your heart rate and make you sweat (moderate-intensity exercise). Do strengthening exercises at least twice a week. This is in addition to the moderate-intensity exercise. Spend less time sitting. Even light physical activity can be beneficial. Watch cholesterol and blood lipids Have your blood tested for lipids and cholesterol at 38 years of age, then have this test every 5 years. Have your cholesterol levels checked more often if: Your lipid or cholesterol levels are high. You are older than 38 years of age. You are at high risk for heart disease. What should I know about cancer screening? Depending on your health history and family history, you may need to have cancer  screening at various ages. This may include screening for: Breast cancer. Cervical cancer. Colorectal cancer. Skin cancer. Lung cancer. What should I know about heart disease, diabetes, and high blood pressure? Blood pressure and heart disease High blood pressure causes heart disease and increases the risk of stroke. This is more likely to develop in people who have high blood pressure readings or are overweight. Have your blood pressure checked: Every 3-5 years if you are 60-74 years of age. Every year if you are 67 years old or older. Diabetes Have regular diabetes screenings. This checks your fasting blood sugar level. Have the screening done: Once every three years after age 46 if you are at a normal weight and have a low risk for diabetes. More often and at a younger age if you are overweight or have a high risk for diabetes. What should I know about preventing infection? Hepatitis B If you have a higher risk for hepatitis B, you should be screened for this virus. Talk with your health care provider to find out if you are at risk for hepatitis B infection. Hepatitis C Testing is recommended for: Everyone born from [birth year] through 1967. Anyone with known risk factors for hepatitis C. Sexually transmitted infections (STIs) Get screened for STIs, including gonorrhea and chlamydia, if: You are sexually active and are younger than 38 years of age. You are older than 38 years of age and your health care provider tells you that you are at risk for this type of infection. Your sexual activity  has changed since you were last screened, and you are at increased risk for chlamydia or gonorrhea. Ask your health care provider if you are at risk. Ask your health care provider about whether you are at high risk for HIV. Your health care provider may recommend a prescription medicine to help prevent HIV infection. If you choose to take medicine to prevent HIV, you should first get tested for HIV. You  should then be tested every 3 months for as long as you are taking the medicine. Pregnancy If you are about to stop having your period (premenopausal) and you may become pregnant, seek counseling before you get pregnant. Take 400 to 800 micrograms (mcg) of folic acid every day if you become pregnant. Ask for birth control (contraception) if you want to prevent pregnancy. Osteoporosis and menopause Osteoporosis is a disease in which the bones lose minerals and strength with aging. This can result in bone fractures. If you are 75 years old or older, or if you are at risk for osteoporosis and fractures, ask your health care provider if you should: Be screened for bone loss. Take a calcium or vitamin D supplement to lower your risk of fractures. Be given hormone replacement therapy (HRT) to treat symptoms of menopause. Follow these instructions at home: Alcohol use Do not drink alcohol if: Your health care provider tells you not to drink. You are pregnant, may be pregnant, or are planning to become pregnant. If you drink alcohol: Limit how much you have to: 0-1 drink a day. Know how much alcohol is in your drink. In the [place] one drink equals one 12 oz bottle of beer (355 mL), one 5 oz glass of wine (148 mL), or one 1 oz glass of hard liquor (44 mL). Lifestyle Do not use any products that contain nicotine or tobacco. These products include cigarettes, chewing tobacco, and vaping devices, such as e-cigarettes. If you need help quitting, ask your health care provider. Do not use street drugs. Do not share needles. Ask your health care provider for help if you need support or information about quitting drugs. General instructions Schedule regular health, dental, and eye exams. Stay current with your vaccines. Tell your health care provider if: You often feel depressed. You have ever been abused or do not feel safe at home. Summary Adopting a healthy lifestyle and getting preventive care are  important in promoting health and wellness. Follow your health care provider's instructions about healthy diet, exercising, and getting tested or screened for diseases. Follow your health care provider's instructions on monitoring your cholesterol and blood pressure. This information is not intended to replace advice given to you by your health care provider. Make sure you discuss any questions you have with your health care provider. Document Revised: 05/25/2020 Document Reviewed: 05/25/2020 Elsevier Patient Education  2022 [employer].

## 2021-03-02 ENCOUNTER — Other Ambulatory Visit: Payer: Self-pay | Admitting: Obstetrics and Gynecology

## 2021-03-02 ENCOUNTER — Ambulatory Visit (INDEPENDENT_AMBULATORY_CARE_PROVIDER_SITE_OTHER): Payer: No Typology Code available for payment source | Admitting: Internal Medicine

## 2021-03-02 ENCOUNTER — Encounter: Payer: Self-pay | Admitting: Internal Medicine

## 2021-03-02 ENCOUNTER — Other Ambulatory Visit: Payer: Self-pay

## 2021-03-02 VITALS — BP 100/80 | HR 87 | Temp 98.3°F | Ht 65.0 in | Wt 142.0 lb

## 2021-03-02 DIAGNOSIS — R5383 Other fatigue: Secondary | ICD-10-CM | POA: Insufficient documentation

## 2021-03-02 DIAGNOSIS — L659 Nonscarring hair loss, unspecified: Secondary | ICD-10-CM

## 2021-03-02 DIAGNOSIS — Z8639 Personal history of other endocrine, nutritional and metabolic disease: Secondary | ICD-10-CM | POA: Insufficient documentation

## 2021-03-02 DIAGNOSIS — Z803 Family history of malignant neoplasm of breast: Secondary | ICD-10-CM

## 2021-03-02 DIAGNOSIS — R0683 Snoring: Secondary | ICD-10-CM

## 2021-03-02 DIAGNOSIS — Z0000 Encounter for general adult medical examination without abnormal findings: Secondary | ICD-10-CM

## 2021-03-02 DIAGNOSIS — R102 Pelvic and perineal pain: Secondary | ICD-10-CM

## 2021-03-02 DIAGNOSIS — Z1159 Encounter for screening for other viral diseases: Secondary | ICD-10-CM

## 2021-03-02 LAB — FERRITIN: Ferritin: 43.3 ng/mL (ref 10.0–291.0)

## 2021-03-02 LAB — CBC WITH DIFFERENTIAL/PLATELET
Basophils Absolute: 0 10*3/uL (ref 0.0–0.1)
Basophils Relative: 0.9 % (ref 0.0–3.0)
Eosinophils Absolute: 0.1 10*3/uL (ref 0.0–0.7)
Eosinophils Relative: 2.1 % (ref 0.0–5.0)
HCT: 39.2 % (ref 36.0–46.0)
Hemoglobin: 13.1 g/dL (ref 12.0–15.0)
Lymphocytes Relative: 33.8 % (ref 12.0–46.0)
Lymphs Abs: 1.9 10*3/uL (ref 0.7–4.0)
MCHC: 33.5 g/dL (ref 30.0–36.0)
MCV: 91.3 fl (ref 78.0–100.0)
Monocytes Absolute: 0.4 10*3/uL (ref 0.1–1.0)
Monocytes Relative: 6.5 % (ref 3.0–12.0)
Neutro Abs: 3.1 10*3/uL (ref 1.4–7.7)
Neutrophils Relative %: 56.7 % (ref 43.0–77.0)
Platelets: 288 10*3/uL (ref 150.0–400.0)
RBC: 4.3 Mil/uL (ref 3.87–5.11)
RDW: 12.7 % (ref 11.5–15.5)
WBC: 5.5 10*3/uL (ref 4.0–10.5)

## 2021-03-02 LAB — TSH: TSH: 1.34 u[IU]/mL (ref 0.35–5.50)

## 2021-03-02 LAB — VITAMIN B12: Vitamin B-12: 340 pg/mL (ref 211–911)

## 2021-03-02 NOTE — Assessment & Plan Note (Signed)
Chronic Snores intermittently Does not sound like anything in excess Does have fatigue seems newer since the birth of her second child Would like to be ruled out for sleep apnea-referral to neurology ordered

## 2021-03-02 NOTE — Assessment & Plan Note (Signed)
Acute States fatigue, which is likely related to not having 2 children-61-month-old and 38-year-old and not sleeping through the night She does snore at times and wonders about sleep apnea, but prior to having kids she feels her sleep is good and did not feel fatigued Check TSH, CBC, CMP, iron levels since she does not eat meat

## 2021-03-02 NOTE — Assessment & Plan Note (Signed)
Has noticed hair loss the past few months ?  Related to iron deficiency, thyroid dysfunction, anemia, B12 deficiency, stress related to second birth and having 2 children She has been taking prenatal and postnatal vitamins consistently the vitamin deficiencies seems less likely She does not eat meat so she could have some degree of iron deficiency Iron levels, CBC and CMP, B12, TSH

## 2021-03-02 NOTE — Assessment & Plan Note (Signed)
Check CBC, iron levels

## 2021-03-02 NOTE — Assessment & Plan Note (Addendum)
From scar tissue, discomfort Seen pelvic PT

## 2021-03-03 LAB — LIPID PANEL
Cholesterol: 240 mg/dL — ABNORMAL HIGH (ref 0–200)
HDL: 73.9 mg/dL (ref >39.00)
LDL Cholesterol: 147 mg/dL — ABNORMAL HIGH (ref 0–99)
NonHDL: 166.47
Total CHOL/HDL Ratio: 3
Triglycerides: 95 mg/dL (ref 0.0–149.0)
VLDL: 19 mg/dL (ref 0.0–40.0)

## 2021-03-03 LAB — COMPREHENSIVE METABOLIC PANEL
ALT: 21 U/L (ref 0–35)
AST: 21 U/L (ref 0–37)
Albumin: 4.9 g/dL (ref 3.5–5.2)
Alkaline Phosphatase: 41 U/L (ref 39–117)
BUN: 9 mg/dL (ref 6–23)
CO2: 28 mEq/L (ref 19–32)
Calcium: 9.7 mg/dL (ref 8.4–10.5)
Chloride: 102 mEq/L (ref 96–112)
Creatinine, Ser: 0.83 mg/dL (ref 0.40–1.20)
GFR: 89.66 mL/min (ref >60.00)
Glucose, Bld: 83 mg/dL (ref 70–99)
Potassium: 4.3 mEq/L (ref 3.5–5.1)
Sodium: 140 mEq/L (ref 135–145)
Total Bilirubin: 0.6 mg/dL (ref 0.2–1.2)
Total Protein: 7.6 g/dL (ref 6.0–8.3)

## 2021-03-03 LAB — IBC PANEL
Iron: 220 ug/dL — ABNORMAL HIGH (ref 42–145)
Saturation Ratios: 62.1 % — ABNORMAL HIGH (ref 20.0–50.0)
TIBC: 354.2 ug/dL (ref 250.0–450.0)
Transferrin: 253 mg/dL (ref 212.0–360.0)

## 2021-03-03 LAB — HEPATITIS C ANTIBODY
Hepatitis C Ab: NONREACTIVE
SIGNAL TO CUT-OFF: 0.03 (ref <1.00)

## 2021-03-12 ENCOUNTER — Encounter: Payer: Self-pay | Admitting: Internal Medicine

## 2021-03-12 DIAGNOSIS — R5383 Other fatigue: Secondary | ICD-10-CM

## 2021-03-14 NOTE — Addendum Note (Signed)
Addended by: Binnie Rail on: 03/14/2021 07:13 PM   Modules accepted: Orders

## 2021-03-15 ENCOUNTER — Other Ambulatory Visit: Payer: Self-pay

## 2021-03-15 ENCOUNTER — Ambulatory Visit
Admission: RE | Admit: 2021-03-15 | Discharge: 2021-03-15 | Disposition: A | Discharge status: Home / Self Care | Payer: No Typology Code available for payment source | Source: Ambulatory Visit | Attending: Obstetrics and Gynecology | Admitting: Obstetrics and Gynecology

## 2021-03-15 ENCOUNTER — Other Ambulatory Visit (INDEPENDENT_AMBULATORY_CARE_PROVIDER_SITE_OTHER): Payer: No Typology Code available for payment source

## 2021-03-15 DIAGNOSIS — R5383 Other fatigue: Secondary | ICD-10-CM

## 2021-03-15 DIAGNOSIS — Z803 Family history of malignant neoplasm of breast: Secondary | ICD-10-CM

## 2021-03-15 LAB — IBC PANEL
Iron: 109 ug/dL (ref 42–145)
Saturation Ratios: 32 % (ref 20.0–50.0)
TIBC: 340.2 ug/dL (ref 250.0–450.0)
Transferrin: 243 mg/dL (ref 212.0–360.0)

## 2021-03-15 LAB — SEDIMENTATION RATE: Sed Rate: 10 mm/hr (ref 0–20)

## 2021-03-15 LAB — C-REACTIVE PROTEIN: CRP: <1 mg/dL (ref 0.5–20.0)

## 2021-03-15 MED ORDER — GADOBUTROL 1 MMOL/ML IV SOLN
7.0000 mL | Freq: Once | INTRAVENOUS | Status: AC | PRN
  Administered 2021-03-15: 7 mL via INTRAVENOUS

## 2021-03-16 LAB — FERRITIN: Ferritin: 32.5 ng/mL (ref 10.0–291.0)

## 2021-03-18 LAB — ANA: Anti Nuclear Antibody (ANA): POSITIVE — AB

## 2021-03-18 LAB — CYCLIC CITRUL PEPTIDE ANTIBODY, IGG: Cyclic Citrullin Peptide Ab: <16 UNITS

## 2021-03-18 LAB — RHEUMATOID FACTOR: Rheumatoid fact SerPl-aCnc: <14 IU/mL (ref <14)

## 2021-03-18 LAB — ANTI-NUCLEAR AB-TITER (ANA TITER): ANA Titer 1: 1:40 {titer} — ABNORMAL HIGH

## 2021-04-06 ENCOUNTER — Ambulatory Visit: Payer: No Typology Code available for payment source | Admitting: Internal Medicine

## 2021-05-12 ENCOUNTER — Ambulatory Visit (INDEPENDENT_AMBULATORY_CARE_PROVIDER_SITE_OTHER): Payer: No Typology Code available for payment source | Admitting: Neurology

## 2021-05-12 ENCOUNTER — Encounter: Payer: Self-pay | Admitting: Neurology

## 2021-05-12 VITALS — BP 100/68 | HR 64 | Ht 64.5 in | Wt 144.0 lb

## 2021-05-12 DIAGNOSIS — R0683 Snoring: Secondary | ICD-10-CM | POA: Diagnosis not present

## 2021-05-12 DIAGNOSIS — G4719 Other hypersomnia: Secondary | ICD-10-CM | POA: Diagnosis not present

## 2021-05-12 NOTE — Progress Notes (Signed)
Subjective:  ?  ?Patient ID: Sandra Petty is a 36 y.o. female. ? ?HPI ? ? ? ?Huston Foley, MD, PhD ?[facility] ?[address], [address] ?[phone] ?[place], [place] [address] ? ?Dear Dr. Lawerance Bach,  ? ?I saw your patient, Sandra Petty, upon your kind request, in my Sleep clinic today for initial consultation of her sleep disorder, in particular, concern for underlying obstructive sleep apnea.  The patient is unaccompanied today.  As you know, Sandra Petty is a 36 yo woman with a benign medical history, who reports snoring and excessive daytime somnolence.  I reviewed your office note from 03/02/2021.  Her Epworth sleepiness score is 7 out of 24, fatigue severity score is 41 out of 63.  She does not have a family history of sleep apnea.  Bedtime is around 11 and rise time around 7.  She lives with her husband and 2 boys, ages 3-1/2 and 5 months.  She breast-feeds her 14-month-old and he may wake up on average 1 or 2 times per night to eat.  On the weekends, her husband will give him a bottle to give her a break.  She does not currently work.  She has no TV in her bedroom, they have no pets in the household.  She is a non-smoker and drinks caffeine in the form of coffee, about 2 cups/day and drinks alcohol in the form of wine, 1 to 3 glasses of wine per day on average.  She has not checked with a lactation specialist if daily wine drinking is recommended during lactation.  She and her husband do not typically sleep in the same bedroom because of their schedules and he also has sleep apnea.  She reports that he has not done well with his CPAP machine thus far.  She is reluctant to consider a CPAP machine for herself.  She had blood work through your office on 03/02/2021 and I reviewed the results.  Vitamin B12 was on the lower end of the spectrum at 340, hepatitis C antibody is nonreactive, ferritin 43.3, iron 220, transferrin 253, TIBC in the normal range at 354.2.  TSH normal at 1.34, lipid panel showed  total cholesterol elevated at 240 and LDL elevated at 147, CMP unremarkable, CBC with differential and platelets unremarkable. ? ?Her Past Medical History Is Significant For: ?Past Medical History:  ?Diagnosis Date  ? Anxiety   ? Depression   ? Right ovarian cyst   ? Vaginal Pap smear, abnormal   ? ? ?Her Past Surgical History Is Significant For: ?Past Surgical History:  ?Procedure Laterality Date  ? BREAST SURGERY  2013  ? "tiny biopsy"  ? WISDOM TOOTH EXTRACTION    ? ? ?Her Family History Is Significant For: ?Family History  ?Problem Relation Age of Onset  ? Cancer Mother   ? Snoring Mother   ? Snoring Maternal Grandfather   ? Cancer Paternal Grandmother   ? Cancer Paternal Grandfather   ? ? ?Her Social History Is Significant For: ?Social History  ? ?Socioeconomic History  ? Marital status: Married  ?  Spouse name: Not on file  ? Number of children: 2  ? Years of education: Not on file  ? Highest education level: Not on file  ?Occupational History  ? Not on file  ?Tobacco Use  ? Smoking status: Former  ?  Packs/day: 0.25  ?  Types: Cigarettes  ?  Quit date: 11/04/2008  ?  Years since quitting: 12.5  ? Smokeless tobacco: Never  ?  Vaping Use  ? Vaping Use: Never used  ?Substance and Sexual Activity  ? Alcohol use: Yes  ?  Alcohol/week: 7.0 standard drinks  ?  Types: 7 Glasses of wine per week  ? Drug use: No  ? Sexual activity: Yes  ?Other Topics Concern  ? Not on file  ?Social History Narrative  ? Lives at home with husband and 2 young boys  ? Right handed  ? Caffeine: coffee 2 cups/day  ? ?Social Determinants of Health  ? ?Financial Resource Strain: Not on file  ?Food Insecurity: Not on file  ?Transportation Needs: Not on file  ?Physical Activity: Not on file  ?Stress: Not on file  ?Social Connections: Not on file  ? ? ?Her Allergies Are:  ?No Known Allergies:  ? ?Her Current Medications Are:  ?Outpatient Encounter Medications as of 05/12/2021  ?Medication Sig  ? acetaminophen (TYLENOL) 325 MG tablet Take 2 tablets  (650 mg total) by mouth every 4 (four) hours as needed (for pain scale < 4).  ? ibuprofen (ADVIL) 600 MG tablet Take 1 tablet (600 mg total) by mouth every 6 (six) hours.  ? Misc Natural Products (SAMBUCUS ELDERBERRY IMMUNE) CHEW Chew by mouth.  ? Prenat w/o A-FE-Methfol-FA-DHA (PNV-DHA PO) Take 1 each by mouth daily.   ? Probiotic Product (PROBIOTIC DAILY PO) Take by mouth.  ? ?No facility-administered encounter medications on file as of 05/12/2021.  ?: ? ? ?Review of Systems:  ?Out of a complete 14 point review of systems, all are reviewed and negative with the exception of these symptoms as listed below: ? ?Review of Systems  ?Neurological:   ?     Patient here alone for sleep consult to address tiredness and intermittent snoring. She states even when she gets a decent amount of sleep she is still tired. She states this mainly occurred after she had her son in November. Between her two young boys she doesn't get good sleep but even when she gets decent sleep she is still tired. If she doesn't drink coffee she doesn't feel like doing anything and is tired all day. She used to not need caffeine daily but now she feels like she does. She also breastfeeds and is worried about the effects on her baby. ESS 7 FSS 41.  ? ?Objective:  ?Neurological Exam ? ?Physical Exam ?Physical Examination:  ? ?Vitals:  ? 05/12/21 1534  ?BP: 100/68  ?Pulse: 64  ? ? ?General Examination: The patient is a very pleasant 36 y.o. female in no acute distress. She appears well-developed and well-nourished and well groomed.  ? ?HEENT: Normocephalic, atraumatic, pupils are equal, round and reactive to light, extraocular tracking is good without limitation to gaze excursion or nystagmus noted. Hearing is grossly intact. Face is symmetric with normal facial animation. Speech is clear with no dysarthria noted. There is no hypophonia. There is no lip, neck/head, jaw or voice tremor. Neck is supple with full range of passive and active motion. There  are no carotid bruits on auscultation. Oropharynx exam reveals: mild mouth dryness, good dental hygiene and mild airway crowding, due to airway entry, prominent uvula, Mallampati class II, tonsillar size of about 1+.  Tongue protrudes centrally and palate elevates symmetrically.  He has significant.  Neck circumference 13 1/8 inches. ? ?Chest: Clear to auscultation without wheezing, rhonchi or crackles noted. ? ?Heart: S1+S2+0, regular and normal without murmurs, rubs or gallops noted.  ? ?Abdomen: Soft, non-tender and non-distended. ? ?Extremities: There is no obvious edema around her  feet and ankles.   ? ?Skin: Warm and dry without trophic changes noted.  ? ?Musculoskeletal: exam reveals no obvious joint deformities.  ? ?Neurologically:  ?Mental status: The patient is awake, alert and oriented in all 4 spheres. Her immediate and remote memory, attention, language skills and fund of knowledge are appropriate. There is no evidence of aphasia, agnosia, apraxia or anomia. Speech is clear with normal prosody and enunciation. Thought process is linear. Mood is normal and affect is normal.  ?Cranial nerves II - XII are as described above under HEENT exam.  ?Motor exam: Normal bulk, strength and tone is noted. There is no obvious tremor.  Fine motor skills and coordination: grossly intact.  ?Cerebellar testing: No dysmetria or intention tremor. There is no truncal or gait ataxia.  ?Sensory exam: intact to light touch in the upper and lower extremities.  ?Gait, station and balance: She stands easily. No veering to one side is noted. No leaning to one side is noted. Posture is age-appropriate and stance is narrow based. Gait shows normal stride length and normal pace. No problems turning are noted.  ? ?Assessment and Plan:  ?In summary, Shelvy Heckert is a very pleasant 36 y.o.-year old female with a history and physical exam concerning for obstructive sleep apnea (OSA). I had a long chat with the patient about my findings and  the diagnosis of OSA, its prognosis and treatment options. We talked about medical treatments, surgical interventions and non-pharmacological approaches. I explained in particular the risks and ramificati

## 2021-05-12 NOTE — Patient Instructions (Signed)
Thank you for choosing [facility] for your sleep related care! It was nice to meet you today!  ? ?Here is what we discussed today:  ?  ?Based on your symptoms and your exam I believe you are at risk for obstructive sleep apnea (aka OSA). We should proceed with a sleep study to determine whether you do or do not have OSA and how severe it is. Even, if you have mild OSA, I may want you to consider treatment with CPAP, as treatment of even borderline or mild sleep apnea can result and improvement of symptoms such as sleep disruption, daytime sleepiness, nighttime bathroom breaks, restless leg symptoms, improvement of headache syndromes, even improved mood disorder.  ? ?As explained, an attended sleep study (meaning you get to stay overnight in the sleep lab), lets [place] monitor sleep-related behaviors such as sleep talking and leg movements in sleep, in addition to monitoring for sleep apnea.  A home sleep test is a screening tool for sleep apnea diagnosis only, but unfortunately, does not help with any other sleep-related diagnoses. ? ?I understand, that you would prefer a home sleep test. I ordered this today. ? ?Please remember, the long-term risks and ramifications of untreated moderate to severe obstructive sleep apnea may include (but are not limited to): increased risk for cardiovascular disease, including congestive heart failure, stroke, difficult to control hypertension, treatment resistant obesity, arrhythmias, especially irregular heartbeat commonly known as A. Fib. (atrial fibrillation); even type 2 diabetes has been linked to untreated OSA.  ? ?Other correlations that untreated obstructive sleep apnea include macular edema which is swelling of the retina in the eyes, droopy eyelid syndrome, and elevated hemoglobin and hematocrit levels (often referred to as polycythemia). ? ?Sleep apnea can cause disruption of sleep and sleep deprivation in most cases, which, in turn, can cause recurrent  headaches, problems with memory, mood, concentration, focus, and vigilance. Most people with untreated sleep apnea report excessive daytime sleepiness, which can affect their ability to drive. Please do not drive or use heavy equipment or machinery, if you feel sleepy! Patients with sleep apnea can also develop difficulty initiating and maintaining sleep (aka insomnia).  ? ?Having sleep apnea may increase your risk for other sleep disorders, including involuntary behaviors sleep such as sleep terrors, sleep talking, sleepwalking.   ? ?Having sleep apnea can also increase your risk for restless leg syndrome and leg movements at night.  ? ?Please note that untreated obstructive sleep apnea may carry additional perioperative morbidity. Patients with significant obstructive sleep apnea (typically, in the moderate to severe degree) should receive, if possible, perioperative PAP (positive airway pressure) therapy and the surgeons and particularly the anesthesiologists should be informed of the diagnosis and the severity of the sleep disordered breathing.  ? ?We will call you or email you through [facility] with regards to your test results and plan a follow-up in sleep clinic accordingly. ?Most likely, you will hear from one of our nurses.  ? ?Our sleep lab administrative assistant will call you to schedule your sleep study and give you further instructions, regarding the check in process for the sleep study, arrival time, what to bring, when you can expect to leave after the study, etc., and to answer any other logistical questions you may have. If you don't hear back from her by about 2 weeks from now, please feel free to call her direct line at [phone] or you can call our general clinic number, or email [place] through My Chart.  ? ?

## 2021-08-09 ENCOUNTER — Telehealth: Payer: Self-pay | Admitting: Neurology

## 2021-08-09 DIAGNOSIS — G4719 Other hypersomnia: Secondary | ICD-10-CM

## 2021-08-09 DIAGNOSIS — R0683 Snoring: Secondary | ICD-10-CM

## 2021-08-09 NOTE — Telephone Encounter (Signed)
Patient left a voicemail on my phone stating she would like to proceed on getting the sleep study schedule. When you get a chance can you put an order in Thank you

## 2021-08-09 NOTE — Telephone Encounter (Signed)
Home sleep test ordered 

## 2021-08-12 NOTE — Telephone Encounter (Signed)
Noted, faxed notes- it is pending.

## 2021-08-17 NOTE — Telephone Encounter (Signed)
HST- Drucie Opitz: [id number] (exp. 08/12/21 to 11/12/21).  Spoke to the patient she is scheduled at [facility] for 09/07/21 at 3:30 pm.  Mailed packet to the patient.

## 2021-09-07 ENCOUNTER — Ambulatory Visit: Payer: No Typology Code available for payment source | Admitting: Neurology

## 2021-09-07 DIAGNOSIS — G4710 Hypersomnia, unspecified: Secondary | ICD-10-CM

## 2021-09-07 DIAGNOSIS — R0683 Snoring: Secondary | ICD-10-CM

## 2021-09-07 DIAGNOSIS — G4719 Other hypersomnia: Secondary | ICD-10-CM

## 2021-09-09 NOTE — Procedures (Signed)
   [facility]  HOME SLEEP TEST (Watch PAT) REPORT  STUDY DATE: 09/07/2021  DOB: May 24, 1985  MRN: [id number]  ORDERING CLINICIAN: Huston Foley, MD, PhD   REFERRING CLINICIAN: Pincus Sanes, MD   CLINICAL INFORMATION/HISTORY:  37 yo woman with a benign medical history, who reports snoring and excessive daytime somnolence.   Epworth sleepiness score: 7/24.  BMI: 23.9 kg/m  FINDINGS:   Sleep Summary:   Total Recording Time (hours, min): 7 hours, 40 min  Total Sleep Time (hours, min):  7 hours, 6 min  Percent REM (%):    27.2%   Respiratory Indices:   Calculated pAHI (per hour):  0.6/hour         REM pAHI:    0/hour       NREM pAHI: 0.8/hour  Central pAHI: 0.5/hour  Oxygen Saturation Statistics:    Oxygen Saturation (%) Mean: 96%   Minimum oxygen saturation (%):                 95%   O2 Saturation Range (%): 95-100%    O2 Saturation (minutes) <=88%: 0 min  Pulse Rate Statistics:   Pulse Mean (bpm):    66/min    Pulse Range (56-91/min)   IMPRESSION: Primary snoring  RECOMMENDATION:  This home sleep test does not demonstrate any significant obstructive or central sleep disordered breathing.  Total AHI was 0.6/h, O2 nadir 95%.  Some snoring was noted and appeared to be mild and intermittent, at times in the moderate range. For disturbing snoring, an oral appliance (through dentistry or orthodontics) can be considered. Other causes of the patient's symptoms, including circadian rhythm disturbances, an underlying mood disorder, medication effect and/or an underlying medical problem cannot be ruled out based on this test. Clinical correlation is recommended. The patient should be cautioned not to drive, work at heights, or operate dangerous or heavy equipment when tired or sleepy. Review and reiteration of good sleep hygiene measures should be pursued with any patient. The patient can follow up with her referring provider, who will be notified of  the test results.   I certify that I have reviewed the raw data recording prior to the issuance of this report in accordance with the standards of the American Academy of Sleep Medicine (AASM).  INTERPRETING PHYSICIAN:   Huston Foley, MD, PhD  Board Certified in Neurology and Sleep Medicine  [facility] [address] [place], [place] [address] [phone]

## 2021-09-09 NOTE — Progress Notes (Signed)
See procedure note.

## 2021-09-21 ENCOUNTER — Telehealth: Payer: Self-pay | Admitting: *Deleted

## 2021-09-21 NOTE — Telephone Encounter (Signed)
Spoke with patient and discussed her sleep study results. Pt would like to think about the oral appliance. She will let us know if she wants to move forward with that. She would like a hard copy of the results mailed to her (I confirmed address on file is correct). She will follow-up with PCP (referring provider).  Results sent to PCP, Dr Cheryll Cockayne.

## 2021-09-21 NOTE — Telephone Encounter (Signed)
-----   Message from Huston Foley, MD sent at 09/09/2021  6:51 PM EDT ----- Patient referred by Dr. Lawerance Bach, seen by me on 05/12/2021, HST on 09/07/2021.   Please call and notify the patient that the recent home sleep test did not show any significant obstructive sleep apnea and treatment with a CPAP or similar machine is not necessary.  Some snoring was noted.  For disturbing snoring, a dental device can be considered through dentistry or orthodontics.  She can talk to her dentist for the possibility of using an oral appliance, unfortunately, it may or may not be covered by her insurance.  If she would like that referral to a dentist through our office, we can facilitate.  At this juncture, she can follow up with the referring provider.  Thanks,  Huston Foley, MD, PhD [facility])

## 2022-01-03 ENCOUNTER — Encounter: Payer: Self-pay | Admitting: Neurology

## 2023-07-22 IMAGING — US US MFM OB FOLLOW-UP
1 series · 13 of 28 positions shown · non-contrast
Comparison: none

[Series 1: us mfm ob follow-up · 13 of 67 slices shown]
[im 3/67]
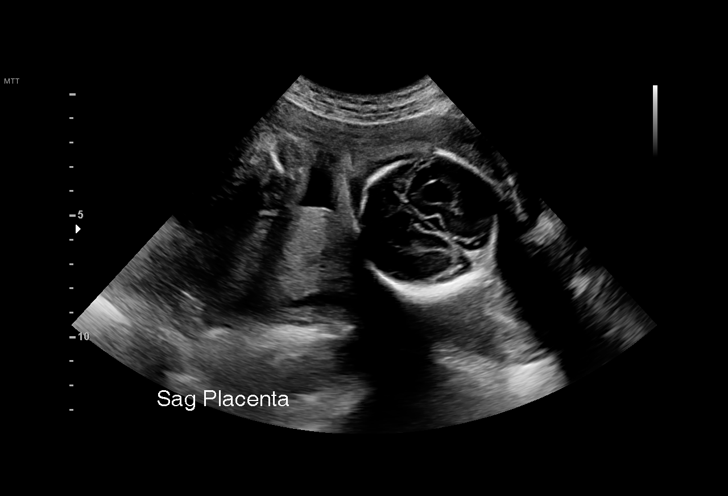
[im 8/67]
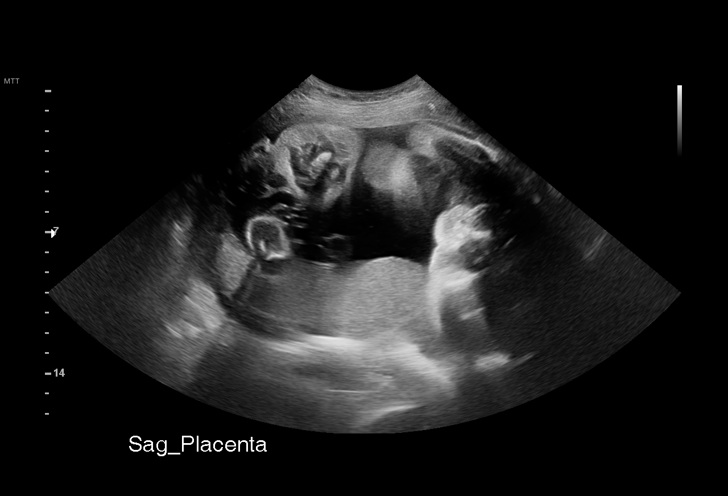
[im 13/67]
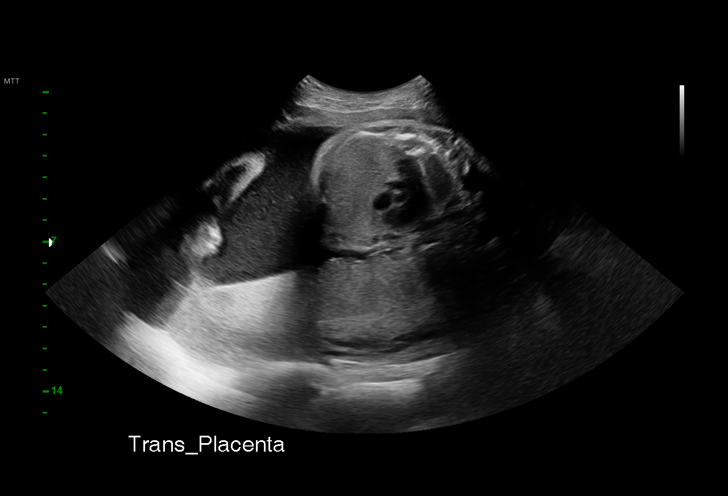
[im 18/67]
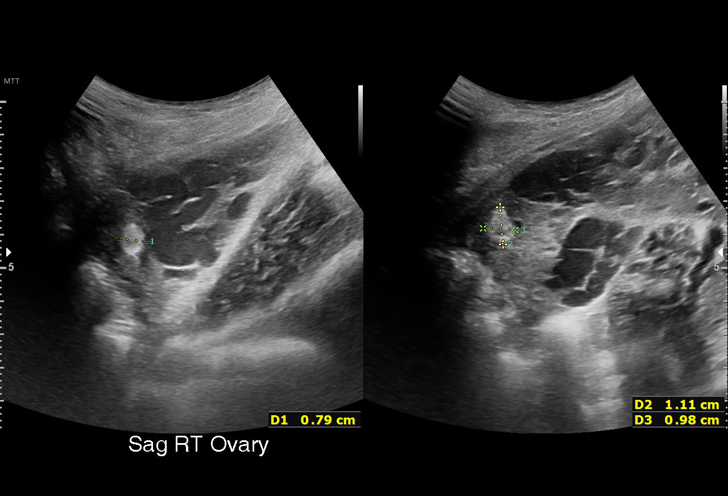
[im 23/67]
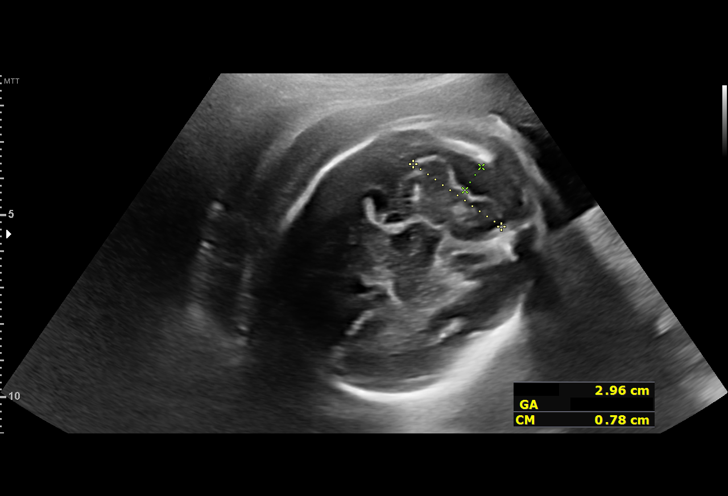
[im 27/67]
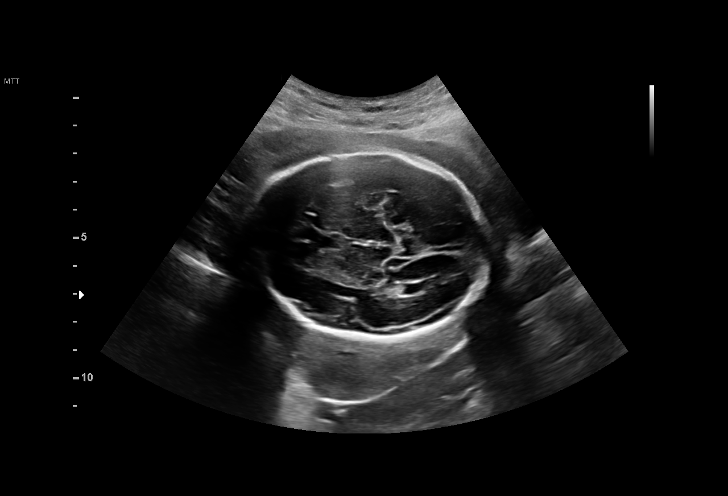
[im 35/67]
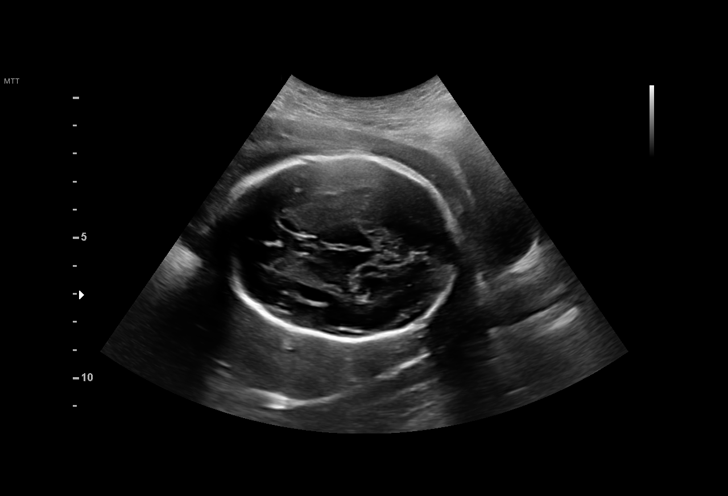
[im 40/67]
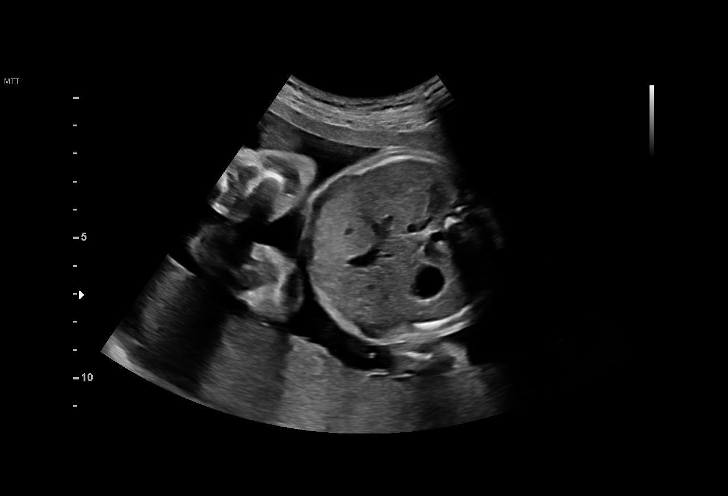
[im 45/67]
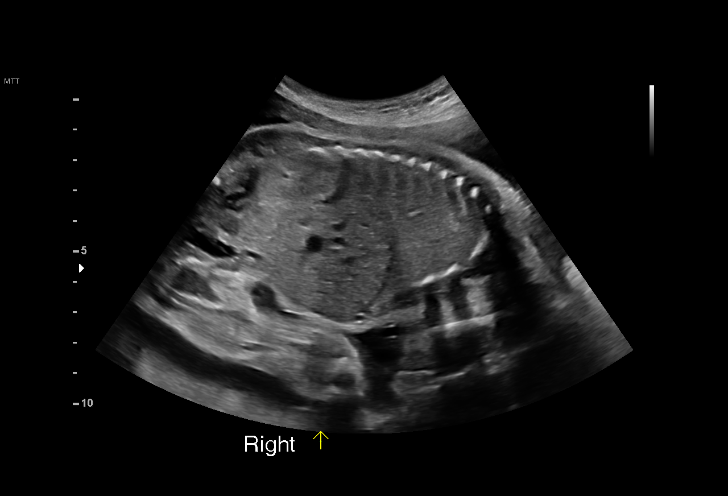
[im 49/67]
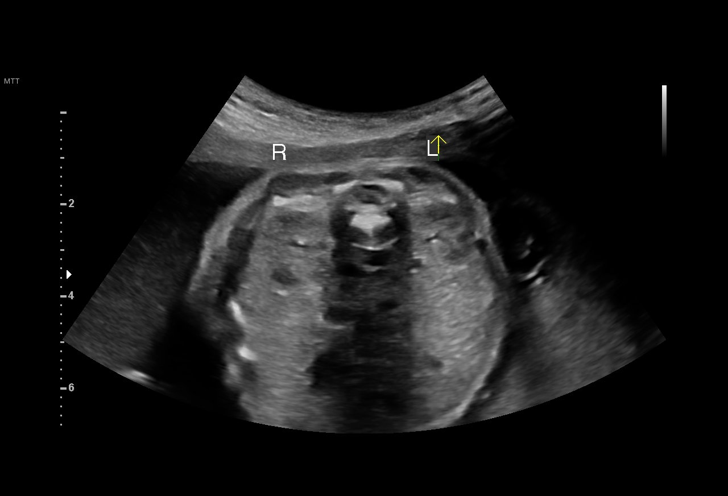
[im 54/67]
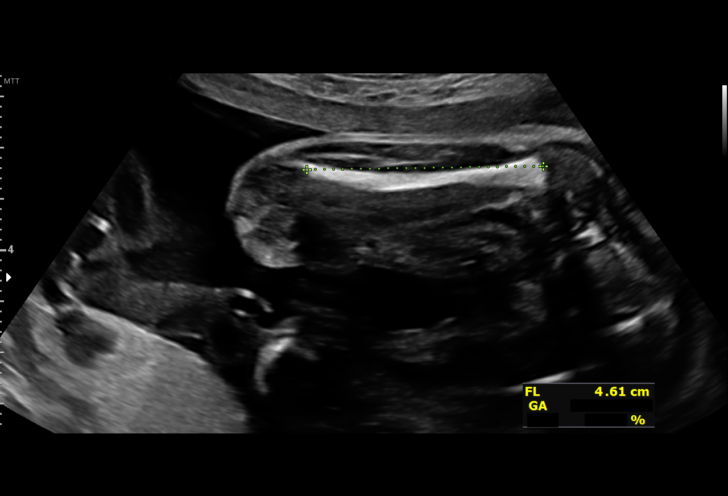
[im 59/67]
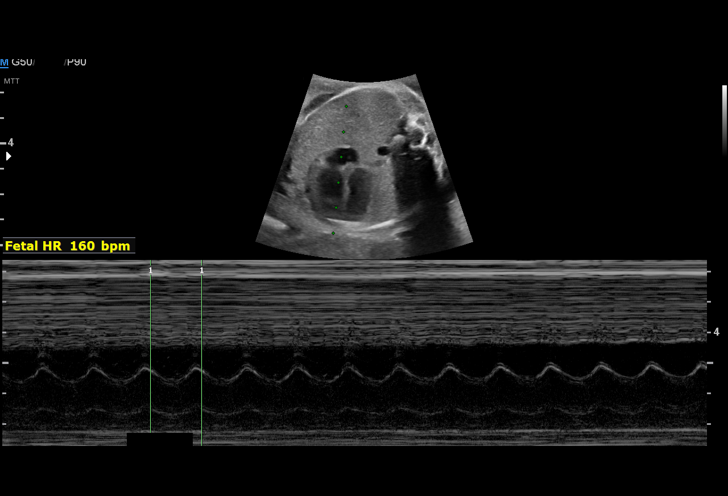
[im 64/67]
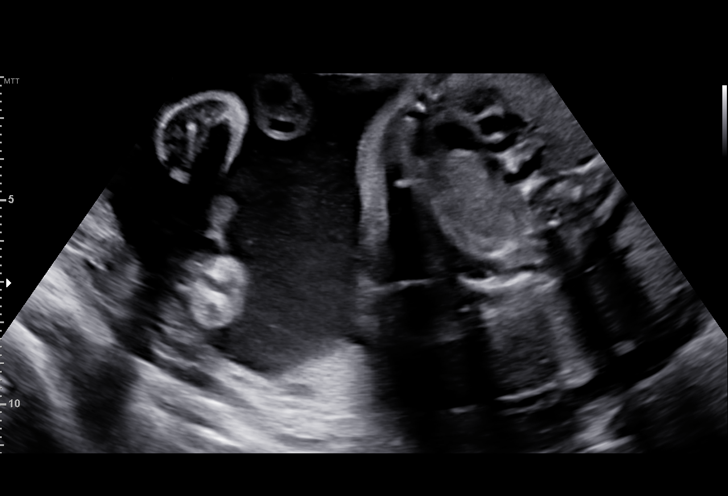

[13 of 28 positions shown; findings below may reference images not displayed]

Indications

 Advanced maternal age multigravida 35+,
 second trimester
 Marginal insertion of umbilical cord affecting
 management of mother in second trimester
 Fetal cardiac anomaly affecting pregnancy,
 antepartum (suspected Rt AOA)
 25 weeks gestation of pregnancy
 Abnormal fetal ultrasound ([facility])
 Encounter for antenatal screening for
 malformations
 LR NIPS
Fetal Evaluation

 Num Of Fetuses:         1
 Fetal Heart Rate(bpm):  160
 Cardiac Activity:       Observed
 Presentation:           Cephalic
 Placenta:               Posterior
 P. Cord Insertion:      Marginal insertion

 Amniotic Fluid
 AFI FV:      Within normal limits

                             Largest Pocket(cm)

Biometry

 BPD:      65.4  mm     G. Age:  26w 3d         74  %    CI:        74.18   %    70 - 86
                                                         FL/HC:      19.2   %    18.7 -
 HC:      241.1  mm     G. Age:  26w 1d         54  %    HC/AC:      1.07        1.04 -
 AC:      226.3  mm     G. Age:  27w 0d         86  %    FL/BPD:     70.8   %    71 - 87
 FL:       46.3  mm     G. Age:  25w 3d         34  %    FL/AC:      20.5   %    20 - 24
 CER:        31  mm     G. Age:  26w 6d         85  %
 LV:        4.5  mm
 CM:        7.8  mm
 Est. FW:     922  gm      2 lb 1 oz     78  %
OB History

 Blood Type:   A-
 Gravidity:    4         Term:   1        Prem:   0        SAB:   2
 Living:       1
Gestational Age

 LMP:           25w 3d        Date:  02/25/20                 EDD:   12/01/20
 U/S Today:     26w 2d                                        EDD:   11/25/20
 Best:          25w 3d     Det. By:  LMP  (02/25/20)          EDD:   12/01/20
Anatomy

 Cranium:               Appears normal         LVOT:                   Abnormal, see
                                                                       comments
 Cavum:                 Appears normal         Aortic Arch:            Previously seen
 Ventricles:            Appears normal         Ductal Arch:            Previously seen
 Choroid Plexus:        Previously seen        Diaphragm:              Appears normal
 Cerebellum:            Appears normal         Stomach:                Appears normal, left
                                                                       sided
 Posterior Fossa:       Appears normal         Abdomen:                Previously seen
 Nuchal Fold:           Previously seen        Abdominal Wall:         Previously seen
 Face:                  Orbits and profile     Cord Vessels:           Previously seen
                        previously seen
 Lips:                  Previously seen        Kidneys:                Appear normal
 Palate:                Limited Views          Bladder:                Appears normal
                        Previously
 Thoracic:              Appears normal         Spine:                  Previously seen
 Heart:                 Previously seen        Upper Extremities:      Previously seen
 RVOT:                  Previously seen        Lower Extremities:      Appears normal

 Other:  VC, 3VV and 3VTV visualized previously.
Targeted Anatomy

 Thorax
 3 V Trachea View:      Abnl-see
                        comments
Cervix Uterus Adnexa
 Cervix
 Not visualized (advanced GA >10wks)

 Uterus
 No abnormality visualized.

 Right Ovary
 Within normal limits.

 Left Ovary
 Within normal limits.

 Cul De Sac
 No free fluid seen.

 Adnexa
 No abnormality visualized.
Comments

 This patient was seen for a follow up growth scan due to
 advanced maternal age and a marginal placental cord
 insertion.  The patient had a fetal echocardiogram performed
 with [[facility]OSPITAL] pediatric cardiology that confirmed a right-sided
 aortic arch.  She denies any problems since her last exam.
 She was informed that the fetal growth and amniotic fluid
 level appears appropriate for her gestational age.
 The right-sided aortic arch continues to be noted on today's
 exam.  The patient was advised that a right-sided aortic arch
 may have variable presentations in the child.
 Most children are asymptomatic whereas others especially
 those where the right-sided aortic arch forms a vascular ring
 around the trachea and/or esophagus (compressing them),
 may develop trouble breathing or trouble swallowing which
 may require surgical repair. Her baby will require further
 evaluation after birth.
 Due to the marginal placental cord insertion, a follow-up
 growth scan was scheduled in 4 weeks.
